# Patient Record
Sex: Female | Born: 1955
Health system: Southern US, Community
[De-identification: ages and names within clinical notes are randomized; demographics above are authoritative.]

## PROBLEM LIST (undated history)

## (undated) DIAGNOSIS — N9489 Other specified conditions associated with female genital organs and menstrual cycle: Secondary | ICD-10-CM

## (undated) DIAGNOSIS — F419 Anxiety disorder, unspecified: Secondary | ICD-10-CM

## (undated) DIAGNOSIS — H9190 Unspecified hearing loss, unspecified ear: Secondary | ICD-10-CM

## (undated) DIAGNOSIS — I10 Essential (primary) hypertension: Secondary | ICD-10-CM

## (undated) DIAGNOSIS — M1990 Unspecified osteoarthritis, unspecified site: Secondary | ICD-10-CM

## (undated) DIAGNOSIS — M797 Fibromyalgia: Secondary | ICD-10-CM

## (undated) DIAGNOSIS — D649 Anemia, unspecified: Secondary | ICD-10-CM

## (undated) DIAGNOSIS — R7989 Other specified abnormal findings of blood chemistry: Secondary | ICD-10-CM

## (undated) DIAGNOSIS — K9041 Non-celiac gluten sensitivity: Secondary | ICD-10-CM

## (undated) DIAGNOSIS — K5792 Diverticulitis of intestine, part unspecified, without perforation or abscess without bleeding: Secondary | ICD-10-CM

## (undated) DIAGNOSIS — N83201 Unspecified ovarian cyst, right side: Secondary | ICD-10-CM

## (undated) HISTORY — PX: ABDOMINAL HYSTERECTOMY: SHX81

## (undated) HISTORY — DX: Unspecified ovarian cyst, right side: N83.201

---

## 1999-12-28 ENCOUNTER — Emergency Department (HOSPITAL_COMMUNITY): Admission: EM | Admit: 1999-12-28 | Discharge: 1999-12-28 | Payer: Self-pay | Admitting: Emergency Medicine

## 2002-01-11 ENCOUNTER — Encounter (INDEPENDENT_AMBULATORY_CARE_PROVIDER_SITE_OTHER): Payer: Self-pay

## 2002-01-12 ENCOUNTER — Inpatient Hospital Stay (HOSPITAL_COMMUNITY): Admission: RE | Admit: 2002-01-12 | Discharge: 2002-01-16 | Payer: Self-pay | Admitting: *Deleted

## 2002-01-13 ENCOUNTER — Encounter: Payer: Self-pay | Admitting: *Deleted

## 2002-01-14 ENCOUNTER — Encounter: Payer: Self-pay | Admitting: Gynecology

## 2002-01-14 ENCOUNTER — Encounter: Payer: Self-pay | Admitting: *Deleted

## 2002-01-15 ENCOUNTER — Encounter: Payer: Self-pay | Admitting: Urology

## 2002-01-19 ENCOUNTER — Encounter: Payer: Self-pay | Admitting: Urology

## 2002-01-19 ENCOUNTER — Ambulatory Visit (HOSPITAL_COMMUNITY): Admission: RE | Admit: 2002-01-19 | Discharge: 2002-01-19 | Payer: Self-pay | Admitting: Urology

## 2002-01-24 ENCOUNTER — Ambulatory Visit (HOSPITAL_COMMUNITY): Admission: RE | Admit: 2002-01-24 | Discharge: 2002-01-24 | Payer: Self-pay | Admitting: Urology

## 2002-01-24 ENCOUNTER — Encounter: Payer: Self-pay | Admitting: Urology

## 2002-05-04 ENCOUNTER — Encounter: Payer: Self-pay | Admitting: Urology

## 2002-05-04 ENCOUNTER — Encounter: Admission: RE | Admit: 2002-05-04 | Discharge: 2002-05-04 | Payer: Self-pay | Admitting: Urology

## 2005-02-25 ENCOUNTER — Other Ambulatory Visit: Admission: RE | Admit: 2005-02-25 | Discharge: 2005-02-25 | Payer: Self-pay | Admitting: *Deleted

## 2007-10-26 ENCOUNTER — Emergency Department (HOSPITAL_COMMUNITY): Admission: EM | Admit: 2007-10-26 | Discharge: 2007-10-26 | Payer: Self-pay | Admitting: Emergency Medicine

## 2008-01-28 ENCOUNTER — Other Ambulatory Visit: Admission: RE | Admit: 2008-01-28 | Discharge: 2008-01-28 | Payer: Self-pay | Admitting: Family Medicine

## 2010-08-09 NOTE — Discharge Summary (Signed)
NAME:  Anna [name], Anna [name]                      ACCOUNT NO.:  [id number]   MEDICAL RECORD NO.:  [id number]                   [name] TYPE:   LOCATION:                                       FACILITY:  [facility]   PHYSICIAN:  Katy Fitch, M.D.               DATE OF BIRTH:  [birth year]   DATE OF ADMISSION:  01/11/2002  DATE OF DISCHARGE:  01/16/2002                                 DISCHARGE SUMMARY   DISCHARGE DIAGNOSES:  1. A 12 week fibroid uterus.  2. Dysfunctional uterine bleeding.  3. Left flank pain.  4. Left hydronephrosis.   PROCEDURE:  1. Total vaginal hysterectomy.  2. Cystoscopy.  3. Left retrograde pyelogram.  4. Attempted insertion of left double J catheter.  5. Peritoneal nephrostomy.   HISTORY OF PRESENT ILLNESS:  Anna [name] is a 55 year old gravida 2, para 2  who was referred by Clovis Riley at [facility] for a six month  history of heavy vaginal bleeding.  Anna [name] also experienced pain with  her periods.  Ultrasound showed a 12 x 7 sized uterus with intramural  fibroids.  Saline infusion sonogram was done to rule out any type of  hyperplastic or neoplastic process.   PAST MEDICAL HISTORY:  Negative.   PAST SURGICAL HISTORY:  Negative.   ALLERGIES:  Anna [name] has no allergies.   MEDICATIONS:  Is on no medications.   She is interested in definitive surgical therapy.   HOSPITAL COURSE:  Anna [name] was admitted on January 11, 2002.  Total  vaginal hysterectomy was performed by Dr. Penni Homans, assisted by Dr.  Lily Peer under general anesthesia.  Findings included a 12 week multiple  fibroid uterus that needed to be morcellated, normal appearing tubes and  ovaries.  Postoperatively [name] developed left lower quadrant pain with  radiation to Anna left flank.  Pelvic ultrasound showed no pelvic mass or  free fluid.  She continued to complain of abdominal pain.  Did not have a  bowel movement.  CT scan of Anna abdomen and pelvis showed moderate  left  hydronephrosis and a dilated ureter in Anna pelvis.  Urological consult was  obtained with Dr. Lindaann Slough.  Anna [name] also did have an elevated  temperature to 101.2.  Cystoscopy, left retrograde pyelogram, and attempted  insertion of left double J catheter was performed by Dr. Brunilda Payor and it was  necessary to perform a peritoneal nephrostomy to attempt antegrade insertion  of a double J catheter.  Postoperatively after Anna second procedure [name]  did well and was able to be discharged in satisfactory condition on December 27, 2001.  Postoperative CBC:  Hematocrit 26.7, hemoglobin 9.2, WBC 6.9,  platelets 160,000.   DISPOSITION:  Anna [name] is to be followed up postoperative at [facility] and also postoperatively with Dr. Brunilda Payor.     [name] . [name], N.P.  Katy Fitch, M.D.    MKH/MEDQ  D:  02/22/2002  T:  02/22/2002  Job:  [id number]

## 2010-08-09 NOTE — Consult Note (Signed)
   NAME:  Anna [name], Anna [name]                      ACCOUNT NO.:  [id number]   MEDICAL RECORD NO.:  [id number]                   PATIENT TYPE:  INP   LOCATION:  9137                                 FACILITY:  [facility]   PHYSICIAN:  Lindaann Slough, M.D.               DATE OF BIRTH:  10-02-1955   DATE OF CONSULTATION:  01/14/2002  DATE OF DISCHARGE:                                   CONSULTATION   REASON FOR CONSULTATION:  Left flank pain and left hydronephrosis.   REFERRING PHYSICIAN:  Timothy P. Fontaine, M.D.   HISTORY OF PRESENT ILLNESS:  The patient is a 55 year old female who had a  total vaginal hysterectomy on 01/11/02.  On the evening of 01/12/02 she  started having left lower quadrant pain with radiation to the left flank.  An ultrasound of the pelvis showed no pelvic mass or free pelvic fluid.  She  continued to complain of abdominal pain and did not have a bowel movement.  A CT scan of the abdomen and pelvis showed moderate left hydronephrosis and  a dilated ureter into the pelvis.  There is no contrast in the ureter.  The  right kidney is normal.   PHYSICAL EXAMINATION:  VITAL SIGNS: Her temperature was up to 101.2 on  01/13/02 but on 01/14/02 temperature maximum was 99.  Blood pressure is  169/88 and pulse 92, respirations 16.   IMPRESSION:  Left hydronephrosis secondary to either partial or complete  ureteral obstruction.   SUGGESTIONS:  Cystoscopy left retrograde pyelogram with insertion of double  J catheter if the obstruction is partial.  If, however, the obstruction is  complete the patient will need exploration of the left ureter with ureteral  reimplantation with psoas or a Boari flap.   The procedures, risks, and the benefits have been discussed with the  patient, her mother, and her son.  They understand and are agreeable.   We will schedule for the procedure this evening.                                               Lindaann Slough, M.D.    MN/MEDQ  D:   01/14/2002  T:  01/15/2002  Job:  [id number]   cc:   Marcial Pacas P. Audie Box, M.D.  [address], [address]  [place]  [place] [address]  Fax: [phone]   Katy Fitch, MD  [address]  [place]  [place] [address]  Fax: [phone]

## 2010-08-09 NOTE — H&P (Signed)
NAME:  Anna [name], Anna [name]                      ACCOUNT NO.:  [id number]   MEDICAL RECORD NO.:  [id number]                   PATIENT TYPE:  AMB   LOCATION:  SDC                                  [facility]ILITY[facility]   PHYSICIAN:  Katy Fitch, MD                 DATE OF BIRTH:  Dec 25, 1955   DATE OF ADMISSION:  DATE OF DISCHARGE:                                HISTORY & PHYSICAL   CHIEF COMPLAINT:  Heavy menstrual periods.   HISTORY OF PRESENT ILLNESS:  The patient is a 55 year old G2, P2 who was  referred by Clovis Riley for a six month history of heavy vaginal bleeding  with her periods.  The patient has also experienced increasing pain with her  periods which when ultrasound in her office showing a 12 x 7 size uterus  with intramural fibroids.  The patient had blood levels drawn of a TSH and  prolactin which were noted to be normal.  The patient was referred over to  rule out any type of hyperplastic or neoplastic process for which the  patient underwent saline infusion sonogram which showed an intramural myoma  abutting the endometrial lining and endometrial biopsy was benign showing  proliferative endometrium.  The patient was given options for medical  therapy versus definitive surgical therapy which the patient is opting for  vaginal hysterectomy.   PAST MEDICAL HISTORY:  None.   PAST SURGICAL HISTORY:  None.   MEDICATIONS:  None.   ALLERGIES:  None.   SOCIAL HISTORY:  Denies any tobacco, alcohol, or drugs.   FAMILY HISTORY:  No epithelial cancers.   PHYSICAL EXAMINATION:  VITAL SIGNS:  Blood pressure 144/82.  HEENT:  Throat clear.  LUNGS:  Clear to auscultation bilaterally.  HEART:  Regular rate and rhythm.  ABDOMEN:  Soft, nontender.  No palpable masses.  EXTREMITIES:  Without any edema, clubbing, cyanosis, or tenderness.  PELVIC:  Mildly enlarged 10 week sized uterus.  Negative for tenderness in  the adnexa.   ASSESSMENT/PLAN:  This 55 year old G2, P2 with  menorrhagia and dysmenorrhea.  The patient is scheduled for total vaginal hysterectomy.  The patient is  explained risks of the procedure including need for laparotomy and possibly  removal of one or both ovaries if appearance is abnormal.  The patient has  given [place] permission for both of these procedures.  Other risks include  bleeding, transfusion, hepatitis or HIV transmission, infections, scar  tissue,  wound breakdown, damage to internal organs, fistula formation, infertility,  anesthetic complications, blood clots, stroke, pulmonary embolism,  myocardial infarction, or death.  The patient states she understands these  risks and desires to proceed.  The patient is scheduled for CBC and urine  pregnancy and on the 21st is to have her procedure.  Katy Fitch, MD    DC/MEDQ  D:  01/07/2002  T:  01/07/2002  Job:  [id number]

## 2010-08-09 NOTE — Op Note (Signed)
NAME:  Anna [name], Anna [name]                      ACCOUNT NO.:  [id number]   MEDICAL RECORD NO.:  [id number]                   PATIENT TYPE:  INP   LOCATION:  9137                                 [facility]ACILITY[facility]   PHYSICIAN:  Lindaann Slough, M.D.               DATE OF BIRTH:  24-Aug-1955   DATE OF PROCEDURE:  01/14/2002  DATE OF DISCHARGE:  01/16/2002                                 OPERATIVE REPORT   PREOPERATIVE DIAGNOSIS:  Left hydronephrosis.   POSTOPERATIVE DIAGNOSIS:  Left hydronephrosis.   PROCEDURE:  Cystoscopy, left retrograde pyelogram and attempted insertion of  left double-J catheter.   SURGEON:  Lindaann Slough, M.D.   ANESTHESIA:  General.   INDICATIONS FOR PROCEDURE:  The patient is a 55 year old female who had a  total vaginal hysterectomy on January 11, 2001.  The day after surgery she  started having left lower quadrant pain radiating to the left flank.  A  pelvic ultrasound showed no free pelvic fluid.  She continued to complain of  pain.  A CT scan of the abdomen and pelvis showed left hydronephrosis with  no contrast in the ureter; and the ureter is dilated into the pelvis.  She  was then scheduled for cystoscopy, left retrograde pyelogram, insertion of  double-J catheter, or if there is accompanied obstruction exploration of the  ureter with ureteral re-implantation.   DESCRIPTION OF PROCEDURE:  Under general anesthesia, the patient was prepped  and draped and placed in the dorsal lithotomy position.  A #22 Wappler  cystoscope was inserted in the bladder.  There was no stone or tumor in the  bladder.  The bladder mucosa is edematous and palpable particularly at the  base of the bladder.  The cone-tipped catheter was then passed through the  cystoscope into the left ureteral orifice.  Contrast was then injected  through the cone-tipped catheter.  The edema at the distal ureter and a J-  hook deformity of the distal ureter with the contrast was passed into a  mildly dilated ureter all the way up into the calices.  The cone-tipped  catheter was then removed.  Several attempts were made to pass a Glidewire  into the ureter, but it was not successful.  The Glidewire could not be  passed to the edge of intramural ureter.  The cystoscope was then removed.  The rigid ureteroscope was then passed in the bladder and passed into the  ureteral orifice but could not be passed beyond the intramural ureter.  The  ureteroscope was then removed.  The cystoscope was then reinserted in the  bladder, and the bladder was emptied, and the cystoscope was removed.   The patient tolerated the procedure well.   PLAN:  The plan is to do a peritoneal nephrostomy and to attempt an  antegrade insertion of a double-J catheter.  Lindaann Slough, M.D.    MN/MEDQ  D:  01/15/2002  T:  01/16/2002  Job:  [id number]   cc:   Marcial Pacas P. Audie Box, M.D.  [address], [address]  [place]  [place] [address]  Fax: [phone]   Katy Fitch, MD  [address]  [place]  [place] [address]  Fax: [phone]

## 2010-08-09 NOTE — Op Note (Signed)
NAME:  Anna, [name]                      ACCOUNT NO.:  [id number]   MEDICAL RECORD NO.:  [id number]                   PATIENT TYPE:  OBV   LOCATION:  9198                                 [facility]ILITY[facility]   PHYSICIAN:  Katy Fitch, MD                 DATE OF BIRTH:  08/21/1955   DATE OF PROCEDURE:  01/11/2002  DATE OF DISCHARGE:                                 OPERATIVE REPORT   PREOPERATIVE DIAGNOSES:  1. Twelve week fibroid uterus.  2. Dysfunctional uterine bleeding.   POSTOPERATIVE DIAGNOSES:  1. Twelve week fibroid uterus.  2. Dysfunctional uterine bleeding.   PROCEDURE:  Total vaginal hysterectomy.   SURGEON:  Katy Fitch, M.D.   ASSISTANTGaetano Hawthorne. Lily Peer, M.D.   ANESTHESIA:  General.   ESTIMATED BLOOD LOSS:  1100 cc.   URINE OUTPUT:  350 cc.   FLUIDS:  3.7 liters.   COMPLICATIONS:  None.   SPECIMENS:  Uterus.   FINDINGS:  Twelve week multiple fibroid uterus that needed to be  morcellated. Normal appearing tubes and ovaries, however.   DESCRIPTION OF PROCEDURE:  The patient was taken to the operating room after  consents and risks of the procedure were discussed.  The patient was given  general anesthesia and then prepped and draped in a normal sterile fashion.  The patient was placed in [place] stirrups and had a Foley catheter inserted  in the bladder.  A weighted speculum was placed in the vagina, and the  anterior lip of the cervix was then grasped with a Jacob's tenaculum, and  the cervicovaginal mucosa was then injected with Pitressin solution 20 units  in 60 cc of saline.  The cervicovaginal mucosa was then incised  circumferentially with a scalpel and the underlying pubovesical cervical  fascia was then dissected off both bluntly and sharply using a curved Mayo  scissor.  Entrance into the posterior cul-de-sac was accomplished without  difficulty after identifying the posterior peritoneum and entering it  sharply.  A long weighted  speculum was then placed into the pelvic cavity.  At this point, further dissection was continued laterally and anteriorly.  I  then was able to get into the anterior cul-de-sac at this point.  The  uterosacral vessels were both clamped and cut on either side and held with 0  Vicryl suture.  The cardinal ligaments were then continued to be clamped and  cut and used 0 Vicryl suture to ligate these pedicles.  Once the cardinal  ligaments were cut, the attempt to get into the bladder was then further  entertained and with sharp dissection using Metzenbaum scissors and a finger  on the vertex we were able to we were able to get into the anterior cul-de-  sac without any evidence of injury to the bladder.  A retractor was then  placed between the uterus and the bladder and the uterus was then started to  begin morcellation  due to the length of the cervix and the size of the  uterus.  The uterus was morcellated using the scalpel and at this point once  down to the level of the last pedicle the next set of pedicles were clamped  and cut and suture ligated with 0 Vicryl.  The next set of pedicles  contained the uterine vessels and these were clamped and cut and ligated  with 0 Vicryl suture.  Morcellation was again was used to help thread it  through the uterus and at this point, the uterus was still stuck in the  pelvis.  The broad ligaments were then clamped on either side, and suture  ligated with 0 Vicryl.  Morcellation at this point, there was noted to be a  very large anterior myoma and with the previous morcellation grasping the  anterior portion of the uterus, this fibroid was able to be brought down  into the field of view and this was begun to be morcellated which then made  seeing the uterus and pedicles much easier.  At this point, once the  morcellation of the anterior fibroid, there a posterior fibroid that was  morcellated out.  The uterus continued to be cored centrally and at this   point it had began to fold on itself.  The left infundibulopelvic ligament  and utero-ovarian ligaments were identified.  The patient did not wish to  have her ovaries removed, and the tubes and ovaries looked normal on that  side.  This was clamped and transected with Mayo scissors and doubly ligated  with 0 Vicryl.  This freed up the left side.  Two more bites were then used  to clamp across the utero-ovarian pedicles on the right side to remove the  rest of the specimen.  This was accomplished and suture ligated with 0  Vicryl.  The pelvis was then irrigated with warm normal saline, and a  vaginal pack was placed.  There was an area on the right side wall that was  noted to have minimal oozing, and a figure-of-eight suture was placed, and  noted to be controlled after placement.  At this point, running of the  vaginal cuff from 3 o'clock to 9 o'clock was done using a running 1-0 Vicryl  on a continuous stitch.  This was done interlocking and hemostasis was noted  to be adequate.  The cuff was then closed with the angles being closed first  in a vertical mattress, first on the left, then on the right.  The rest of  the vaginal cuff was closed with interrupteds of vertical mattresses using 0  Vicryl suture.  At the end, it was noted to be hemostatic and irrigated  despite having morcellation and some oozing from the vaginal cuff.  Throughout the surgery, I felt it would be recommended to pack her vagina  her 2 inch Kerlix with Estrace cream and leave a Foley in for 24 hours.  The  patient was then taken to the recovery room in stable condition.                                               Katy Fitch, MD    DC/MEDQ  D:  01/11/2002  T:  01/11/2002  Job:  [id number]

## 2010-12-20 LAB — POCT I-STAT, CHEM 8
BUN: 17
Calcium, Ion: 1.24
Creatinine, Ser: 1.1
Glucose, Bld: 113 — ABNORMAL HIGH
Hemoglobin: 14.3
Sodium: 141
TCO2: 30

## 2010-12-20 LAB — POCT CARDIAC MARKERS: CKMB, poc: 1.4

## 2014-11-02 ENCOUNTER — Other Ambulatory Visit: Payer: Self-pay | Admitting: Family Medicine

## 2014-11-02 DIAGNOSIS — Z1231 Encounter for screening mammogram for malignant neoplasm of breast: Secondary | ICD-10-CM

## 2014-11-10 ENCOUNTER — Ambulatory Visit: Payer: Self-pay

## 2014-11-17 ENCOUNTER — Ambulatory Visit
Admission: RE | Admit: 2014-11-17 | Discharge: 2014-11-17 | Disposition: A | Discharge status: Home / Self Care | Payer: 59 | Source: Ambulatory Visit | Attending: Family Medicine | Admitting: Family Medicine

## 2014-11-17 DIAGNOSIS — Z1231 Encounter for screening mammogram for malignant neoplasm of breast: Secondary | ICD-10-CM

## 2015-07-11 DIAGNOSIS — M797 Fibromyalgia: Secondary | ICD-10-CM | POA: Diagnosis not present

## 2015-07-11 DIAGNOSIS — I10 Essential (primary) hypertension: Secondary | ICD-10-CM | POA: Diagnosis not present

## 2015-07-11 DIAGNOSIS — M7740 Metatarsalgia, unspecified foot: Secondary | ICD-10-CM | POA: Diagnosis not present

## 2015-07-13 ENCOUNTER — Ambulatory Visit (INDEPENDENT_AMBULATORY_CARE_PROVIDER_SITE_OTHER): Payer: BLUE CROSS/BLUE SHIELD

## 2015-07-13 ENCOUNTER — Encounter: Payer: Self-pay | Admitting: Podiatry

## 2015-07-13 ENCOUNTER — Ambulatory Visit (INDEPENDENT_AMBULATORY_CARE_PROVIDER_SITE_OTHER): Payer: BLUE CROSS/BLUE SHIELD | Admitting: Podiatry

## 2015-07-13 VITALS — BP 153/92 | HR 78 | Resp 16 | Ht 64.0 in | Wt 186.0 lb

## 2015-07-13 DIAGNOSIS — M779 Enthesopathy, unspecified: Secondary | ICD-10-CM | POA: Diagnosis not present

## 2015-07-13 DIAGNOSIS — M7740 Metatarsalgia, unspecified foot: Secondary | ICD-10-CM

## 2015-07-13 MED ORDER — PREDNISONE 10 MG PO TABS: ORAL_TABLET | ORAL | Status: DC

## 2015-07-13 MED ORDER — TRIAMCINOLONE ACETONIDE 10 MG/ML IJ SUSP
10.0000 mg | Freq: Once | INTRAMUSCULAR | Status: AC
  Administered 2015-07-13: 10 mg

## 2015-07-13 NOTE — Progress Notes (Signed)
   Subjective:    Patient ID: Anna [name], female    DOB: 04/04/1955, 60 y.o.   MRN: [id number]  HPI Chief Complaint  Patient presents with  . Foot Pain    Bilateral; Dorsal; pt stated, "both feet just ache; bone of feet hurt; hurts to walk; tender; Left foot hurts more on ball of foot"; x3 weeks  . Toe Pain    Left foot; great toe-joint; pt stated, "Has fibromyalgia"; x2 days      Review of Systems  Constitutional: Positive for activity change and fatigue.  Gastrointestinal: Positive for constipation.  Musculoskeletal: Positive for myalgias, back pain, arthralgias and gait problem.  Allergic/Immunologic: Positive for environmental allergies.  Neurological: Positive for dizziness and light-headedness.  All other systems reviewed and are negative.      Objective:   Physical Exam        Assessment & Plan:

## 2015-07-15 NOTE — Progress Notes (Signed)
Subjective:     Patient ID: Anna [name], female   DOB: [birth year], 60 y.o.   MRN: [id number]  HPI patient states she has pain in the forefoot bilateral and it's just been getting worse over the last few weeks. States that she's tried to change her gait and that's causing other problems and she's had chronic pain on the dorsal of both feet but that's not part of which she is experiencing now   Review of Systems  All other systems reviewed and are negative.      Objective:   Physical Exam  Constitutional: She is oriented to person, place, and time.  Cardiovascular: Intact distal pulses.   Musculoskeletal: Normal range of motion.  Neurological: She is oriented to person, place, and time.  Skin: Skin is warm.  Nursing note and vitals reviewed.  neurovascular status intact muscle strength adequate range of motion within normal limits with patient found to have discomfort in the second metatarsophalangeal joints bilateral with fluid buildup and moderate discomfort in the dorsum of both feet localized in nature. Patient has good digital perfusion and is well oriented 3     Assessment:     Inflammatory tendinitis of the dorsal foot with inflammatory acute capsulitis second MPJ bilateral    Plan:     H&P and x-rays reviewed with patient. Today I did proximal nerve blocks of the forefoot bilateral and aspirated the second MPJ bilateral getting out a small amount of clear fluid and injected with quarter cc dexamethasone Kenalog. I then went ahead and applied padding to reduce pressure and explained we may need to make orthotics and other treatments and she'll reappoint again in 2 weeks to see results placed her on a sterile prep DS Dosepak at the current time  X-ray report indicates that there is mild depression of the arch but no indications of advanced arthritis or stress fracture

## 2015-08-03 ENCOUNTER — Ambulatory Visit: Payer: BLUE CROSS/BLUE SHIELD | Admitting: Podiatry

## 2015-08-24 DIAGNOSIS — L309 Dermatitis, unspecified: Secondary | ICD-10-CM | POA: Diagnosis not present

## 2015-09-13 ENCOUNTER — Ambulatory Visit (HOSPITAL_BASED_OUTPATIENT_CLINIC_OR_DEPARTMENT_OTHER): Admit: 2015-09-13 | Payer: Self-pay | Admitting: Oral Surgery

## 2015-09-13 ENCOUNTER — Encounter (HOSPITAL_BASED_OUTPATIENT_CLINIC_OR_DEPARTMENT_OTHER): Payer: Self-pay

## 2015-09-13 SURGERY — OSTEOTOMY, LE FORT I, MAXILLA, WITH MANDIBULAR OSTEOTOMY
Anesthesia: General | Site: Mouth

## 2015-10-10 ENCOUNTER — Emergency Department (HOSPITAL_COMMUNITY): Payer: BLUE CROSS/BLUE SHIELD

## 2015-10-10 ENCOUNTER — Emergency Department (HOSPITAL_COMMUNITY)
Admission: EM | Admit: 2015-10-10 | Discharge: 2015-10-10 | Disposition: A | Discharge status: Home / Self Care | Payer: BLUE CROSS/BLUE SHIELD | Attending: Emergency Medicine | Admitting: Emergency Medicine

## 2015-10-10 ENCOUNTER — Encounter (HOSPITAL_COMMUNITY): Payer: Self-pay | Admitting: Emergency Medicine

## 2015-10-10 DIAGNOSIS — K625 Hemorrhage of anus and rectum: Secondary | ICD-10-CM | POA: Diagnosis not present

## 2015-10-10 DIAGNOSIS — K5733 Diverticulitis of large intestine without perforation or abscess with bleeding: Secondary | ICD-10-CM | POA: Insufficient documentation

## 2015-10-10 DIAGNOSIS — Z79899 Other long term (current) drug therapy: Secondary | ICD-10-CM | POA: Insufficient documentation

## 2015-10-10 DIAGNOSIS — I10 Essential (primary) hypertension: Secondary | ICD-10-CM | POA: Insufficient documentation

## 2015-10-10 DIAGNOSIS — K5732 Diverticulitis of large intestine without perforation or abscess without bleeding: Secondary | ICD-10-CM | POA: Diagnosis not present

## 2015-10-10 HISTORY — DX: Unspecified osteoarthritis, unspecified site: M19.90

## 2015-10-10 HISTORY — DX: Anxiety disorder, unspecified: F41.9

## 2015-10-10 HISTORY — DX: Non-celiac gluten sensitivity: K90.41

## 2015-10-10 HISTORY — DX: Essential (primary) hypertension: I10

## 2015-10-10 LAB — BASIC METABOLIC PANEL
ANION GAP: 7 (ref 5–15)
BUN: 10 mg/dL (ref 6–20)
CALCIUM: 9.5 mg/dL (ref 8.9–10.3)
CO2: 27 mmol/L (ref 22–32)
CREATININE: 1 mg/dL (ref 0.44–1.00)
Chloride: 104 mmol/L (ref 101–111)
GLUCOSE: 110 mg/dL — AB (ref 65–99)
Potassium: 3.3 mmol/L — ABNORMAL LOW (ref 3.5–5.1)
Sodium: 138 mmol/L (ref 135–145)

## 2015-10-10 LAB — POC OCCULT BLOOD, ED: Fecal Occult Bld: POSITIVE — AB

## 2015-10-10 LAB — CBC WITH DIFFERENTIAL/PLATELET
BASOS ABS: 0 10*3/uL (ref 0.0–0.1)
BASOS PCT: 0 %
EOS ABS: 0.1 10*3/uL (ref 0.0–0.7)
Eosinophils Relative: 1 %
HEMATOCRIT: 40.5 % (ref 36.0–46.0)
Hemoglobin: 13.1 g/dL (ref 12.0–15.0)
Lymphocytes Relative: 15 %
Lymphs Abs: 1.6 10*3/uL (ref 0.7–4.0)
MCH: 31.1 pg (ref 26.0–34.0)
MCHC: 32.3 g/dL (ref 30.0–36.0)
MCV: 96.2 fL (ref 78.0–100.0)
MONO ABS: 0.8 10*3/uL (ref 0.1–1.0)
Monocytes Relative: 7 %
NEUTROS ABS: 8.7 10*3/uL — AB (ref 1.7–7.7)
Neutrophils Relative %: 77 %
PLATELETS: 144 10*3/uL — AB (ref 150–400)
RBC: 4.21 MIL/uL (ref 3.87–5.11)
RDW: 13.1 % (ref 11.5–15.5)
WBC: 11.2 10*3/uL — ABNORMAL HIGH (ref 4.0–10.5)

## 2015-10-10 LAB — ABO/RH: ABO/RH(D): A NEG

## 2015-10-10 LAB — TYPE AND SCREEN
ABO/RH(D): A NEG
ANTIBODY SCREEN: NEGATIVE

## 2015-10-10 MED ORDER — IOPAMIDOL (ISOVUE-300) INJECTION 61%
INTRAVENOUS | Status: AC
  Administered 2015-10-10: 100 mL
  Filled 2015-10-10: qty 100

## 2015-10-10 MED ORDER — HYDROCODONE-ACETAMINOPHEN 5-325 MG PO TABS: 1.0000 | ORAL_TABLET | ORAL | Status: DC | PRN

## 2015-10-10 MED ORDER — SODIUM CHLORIDE 0.9 % IV BOLUS (SEPSIS)
1000.0000 mL | Freq: Once | INTRAVENOUS | Status: AC
  Administered 2015-10-10: 1000 mL via INTRAVENOUS

## 2015-10-10 MED ORDER — CIPROFLOXACIN HCL 500 MG PO TABS: 500.0000 mg | ORAL_TABLET | Freq: Two times a day (BID) | ORAL | Status: DC

## 2015-10-10 MED ORDER — ONDANSETRON HCL 4 MG PO TABS: 4.0000 mg | ORAL_TABLET | Freq: Three times a day (TID) | ORAL | Status: DC | PRN

## 2015-10-10 MED ORDER — ONDANSETRON HCL 4 MG/2ML IJ SOLN
4.0000 mg | Freq: Once | INTRAMUSCULAR | Status: AC
  Administered 2015-10-10: 4 mg via INTRAVENOUS
  Filled 2015-10-10: qty 2

## 2015-10-10 MED ORDER — METRONIDAZOLE 500 MG PO TABS: 500.0000 mg | ORAL_TABLET | Freq: Three times a day (TID) | ORAL | Status: DC

## 2015-10-10 MED ORDER — MORPHINE SULFATE (PF) 4 MG/ML IV SOLN
4.0000 mg | Freq: Once | INTRAVENOUS | Status: AC
  Administered 2015-10-10: 4 mg via INTRAVENOUS
  Filled 2015-10-10: qty 1

## 2015-10-10 NOTE — ED Notes (Signed)
Assisted pt up to RR; tolerated well.

## 2015-10-10 NOTE — ED Provider Notes (Signed)
CSN: [id number]     Arrival date & time 10/10/15  0850 History   First MD Initiated Contact with Patient 10/10/15 0945     Chief Complaint  Patient presents with  . Rectal Bleeding     (Consider location/radiation/quality/duration/timing/severity/associated sxs/prior Treatment) HPI   Pt with hx gluten intolerance and constipation p/w LLQ abdominal pain and cramping, distension, with rectal bleeding and nausea.  States yesterday she developed severe abdominal cramping followed by violent diarrhea, followed by waves of cramping with hematochezia.  States this occurred for two hours yesterday and improved, allowing her to sleep.  She was awoken this morning with intense cramping followed by passing gas and blood per rectum.  Has had associated nausea.  Denies fevers, chills, myalgias, vomiting, urinary , or vaginal symptoms.  Has hx 1 prior abdominal surgery: partial hysterectomy for fibroids.  Has never had a colonoscopy.  Does not have a GI dr.    Past Medical History  Diagnosis Date  . Hypertension   . Anxiety   . Arthritis   . Gluten intolerance    Past Surgical History  Procedure Laterality Date  . Abdominal hysterectomy     History reviewed. No pertinent family history. Social History  Substance Use Topics  . Smoking status: Never Smoker   . Smokeless tobacco: None  . Alcohol Use: 1.2 oz/week    2 Glasses of wine per week   OB History    No data available     Review of Systems  All other systems reviewed and are negative.     Allergies  Glutethimides and Zyrtec  Home Medications   Prior to Admission medications   Medication Sig Start Date End Date Taking? Authorizing Provider  ALPRAZolam Duanne Moron) 0.5 MG tablet Take 0.5 mg by mouth daily as needed for anxiety.   Yes Historical Provider, MD  lisinopril-hydrochlorothiazide (PRINZIDE,ZESTORETIC) 20-12.5 MG tablet Take 1 tablet by mouth daily.  05/08/15  Yes Historical Provider, MD  nabumetone (RELAFEN) 500 MG tablet  Take 500 mg by mouth 2 (two) times daily as needed for mild pain.  07/11/15  Yes Historical Provider, MD  nortriptyline (PAMELOR) 10 MG capsule Take 20 mg by mouth at bedtime.  07/11/15  Yes Historical Provider, MD  predniSONE (DELTASONE) 10 MG tablet 12 day tapering dose Patient not taking: Reported on 10/10/2015 07/13/15   Tamala Fothergill Regal, DPM   Pulse 102  Temp(Src) 98.7 F (37.1 C) (Oral)  Resp 18  Wt 84.369 kg  SpO2 99% Physical Exam  Constitutional: She appears well-developed and well-nourished. No distress.  HENT:  Head: Normocephalic and atraumatic.  Neck: Neck supple.  Cardiovascular: Normal rate and regular rhythm.   Pulmonary/Chest: Effort normal and breath sounds normal. No respiratory distress. She has no wheezes. She has no rales.  Abdominal: Soft. Bowel sounds are normal. She exhibits no distension. There is tenderness in the left lower quadrant. There is no rebound and no guarding.  Genitourinary: Rectal exam shows external hemorrhoid. Rectal exam shows no internal hemorrhoid, no mass, no tenderness and anal tone normal. Guaiac positive stool.  Small amount of frank blood on rectal exam   Neurological: She is alert.  Skin: She is not diaphoretic.  Nursing note and vitals reviewed.   ED Course  Procedures (including critical care time) Labs Review Labs Reviewed  BASIC METABOLIC PANEL - Abnormal; Notable for the following:    Potassium 3.3 (*)    Glucose, Bld 110 (*)    All other components within normal limits  CBC WITH DIFFERENTIAL/PLATELET - Abnormal; Notable for the following:    WBC 11.2 (*)    Platelets 144 (*)    Neutro Abs 8.7 (*)    All other components within normal limits  POC OCCULT BLOOD, ED - Abnormal; Notable for the following:    Fecal Occult Bld POSITIVE (*)    All other components within normal limits  TYPE AND SCREEN  ABO/RH    Imaging Review Ct Abdomen Pelvis W Contrast  10/10/2015  CLINICAL DATA:  Left lower quadrant pain for 6 days. Rectal  bleeding EXAM: CT ABDOMEN AND PELVIS WITH CONTRAST TECHNIQUE: Multidetector CT imaging of the abdomen and pelvis was performed using the standard protocol following bolus administration of intravenous contrast. CONTRAST:  12mL ISOVUE-300 IOPAMIDOL (ISOVUE-300) INJECTION 61% COMPARISON:  None. FINDINGS: Lower chest: There is slight posterior lung base atelectasis bilaterally. Lung bases otherwise are clear. Hepatobiliary: Liver measures 19.6 cm in length. There is fatty infiltration near the fissure for the ligamentum teres. There are scattered small cysts throughout the liver. Largest cyst is in the anterior segment of the right lobe of the liver measuring 1.1 x 1.0 cm. Gallbladder wall is not appreciably thickened. There is no biliary duct dilatation. Pancreas: No pancreatic mass or inflammatory focus. Spleen: No splenic lesions are evident. Adrenals/Urinary Tract: Adrenals appear normal bilaterally. There is a cyst arising from the periphery of the mid left kidney measuring 1 x 1 cm. There is no hydronephrosis on either side. There is no renal or ureteral calculus on either side. Urinary bladder is midline with wall thickness within normal limits. Stomach/Bowel: There is wall thickening throughout the distal descending colon and proximal to mid sigmoid colon. There are multiple diverticula in this region. There is mucosal thickening in the proximal to mid sigmoid colon region consistent with a degree of diverticulitis. There is no abscess or perforation evident in this area of diverticulitis. There is a small amount of loculated fluid in the pelvis which may be secondary to the diverticular inflammatory process. Elsewhere, there is no bowel wall or mesenteric thickening. No bowel obstruction. No free air or portal venous air. Vascular/Lymphatic: There is no abdominal aortic aneurysm. No vascular lesions are evident. There is no appreciable adenopathy in the abdomen or pelvis. Reproductive: Uterus is absent.   There is no pelvic mass. Other: Appendix appears normal. There is no abscess or free-flowing ascites in the abdomen or pelvis. Mild loculated ascites in the pelvis is likely secondary to the sigmoid diverticulitis. There is a minimal ventral hernia containing only fat. Musculoskeletal: There is degenerative change in the lower thoracic spine. There are no blastic or lytic bone lesions. There is no intramuscular or abdominal wall lesion. IMPRESSION: Diverticulitis involving the proximal and mid sigmoid colon regions without abscess or perforation. Mild loculated fluid in the pelvis is felt to be most likely due to inflammation from the diverticulitis. No bowel obstruction. No abscess. No free air. Appendix appears normal. No renal or ureteral calculus.  No hydronephrosis. Prominent liver with mild fatty infiltration near the fissure for the ligamentum teres. Scattered small liver cysts are noted. Uterus absent.  Minimal ventral hernia containing only fat. Electronically Signed   By: Lowella Grip III M.D.   On: 10/10/2015 12:02   I have personally reviewed and evaluated these images and lab results as part of my medical decision-making.   EKG Interpretation None      MDM   Final diagnoses:  Diverticulitis of large intestine without perforation or abscess with  bleeding   Afebrile nontoxic patient with LLQ abdominal pain and rectal bleeding with frank blood on rectal exam.  Labs with leukocytosis, normal Hgb.  CT demonstrates diverticulitis without complication.  Pt comfortable with discharge home, discussed strict return precautions.  She is tolerating PO.  D/C home with flagyl, cipro, norco, zofran, PCP, GI follow up.  Discussed result, findings, treatment, and follow up  with patient.  Pt given return precautions.  Pt verbalizes understanding and agrees with plan.         Clayton Bibles, PA-C 10/10/15 [place], MD 10/11/15 [phone]

## 2015-10-10 NOTE — Discharge Instructions (Signed)
Read the information below.  Use the prescribed medication as directed.  Please discuss all new medications with your pharmacist.  Do not take additional tylenol while taking the prescribed pain medication to avoid overdose.  You may return to the Emergency Department at any time for worsening condition or any new symptoms that concern you.    If you develop high fevers, worsening abdominal pain, uncontrolled vomiting, profuse bleeding, lightheadedness, you feel like you are going to faint, or are unable to tolerate fluids or antibiotics by mouth, return to the ER for a recheck.     Diverticulitis Diverticulitis is inflammation or infection of small pouches in your colon that form when you have a condition called diverticulosis. The pouches in your colon are called diverticula. Your colon, or large intestine, is where water is absorbed and stool is formed. Complications of diverticulitis can include:  Bleeding.  Severe infection.  Severe pain.  Perforation of your colon.  Obstruction of your colon. CAUSES  Diverticulitis is caused by bacteria. Diverticulitis happens when stool becomes trapped in diverticula. This allows bacteria to grow in the diverticula, which can lead to inflammation and infection. RISK FACTORS People with diverticulosis are at risk for diverticulitis. Eating a diet that does not include enough fiber from fruits and vegetables may make diverticulitis more likely to develop. SYMPTOMS  Symptoms of diverticulitis may include:  Abdominal pain and tenderness. The pain is normally located on the left side of the abdomen, but may occur in other areas.  Fever and chills.  Bloating.  Cramping.  Nausea.  Vomiting.  Constipation.  Diarrhea.  Blood in your stool. DIAGNOSIS  Your health care provider will ask you about your medical history and do a physical exam. You may need to have tests done because many medical conditions can cause the same symptoms as  diverticulitis. Tests may include:  Blood tests.  Urine tests.  Imaging tests of the abdomen, including X-rays and CT scans. When your condition is under control, your health care provider may recommend that you have a colonoscopy. A colonoscopy can show how severe your diverticula are and whether something else is causing your symptoms. TREATMENT  Most cases of diverticulitis are mild and can be treated at home. Treatment may include:  Taking over-the-counter pain medicines.  Following a clear liquid diet.  Taking antibiotic medicines by mouth for 7-10 days. More severe cases may be treated at a hospital. Treatment may include:  Not eating or drinking.  Taking prescription pain medicine.  Receiving antibiotic medicines through an IV tube.  Receiving fluids and nutrition through an IV tube.  Surgery. HOME CARE INSTRUCTIONS   Follow your health care provider's instructions carefully.  Follow a full liquid diet or other diet as directed by your health care provider. After your symptoms improve, your health care provider may tell you to change your diet. He or she may recommend you eat a high-fiber diet. Fruits and vegetables are good sources of fiber. Fiber makes it easier to pass stool.  Take fiber supplements or probiotics as directed by your health care provider.  Only take medicines as directed by your health care provider.  Keep all your follow-up appointments. SEEK MEDICAL CARE IF:   Your pain does not improve.  You have a hard time eating food.  Your bowel movements do not return to normal. SEEK IMMEDIATE MEDICAL CARE IF:   Your pain becomes worse.  Your symptoms do not get better.  Your symptoms suddenly get worse.  You have  a fever.  You have repeated vomiting.  You have bloody or black, tarry stools. MAKE SURE YOU:   Understand these instructions.  Will watch your condition.  Will get help right away if you are not doing well or get worse.     This information is not intended to replace advice given to you by your health care provider. Make sure you discuss any questions you have with your health care provider.   Document Released: 12/18/2004 Document Revised: 03/15/2013 Document Reviewed: 02/02/2013 Elsevier Interactive Patient Education 2016 Elsevier Inc.  High-Fiber Diet Fiber, also called dietary fiber, is a type of carbohydrate found in fruits, vegetables, whole grains, and beans. A high-fiber diet can have many health benefits. Your health care provider may recommend a high-fiber diet to help:  Prevent constipation. Fiber can make your bowel movements more regular.  Lower your cholesterol.  Relieve hemorrhoids, uncomplicated diverticulosis, or irritable bowel syndrome.  Prevent overeating as part of a weight-loss plan.  Prevent heart disease, type 2 diabetes, and certain cancers. WHAT IS MY PLAN? The recommended daily intake of fiber includes:  38 grams for men under age 67.  71 grams for men over age 47.  34 grams for women under age 35.  35 grams for women over age 60. You can get the recommended daily intake of dietary fiber by eating a variety of fruits, vegetables, grains, and beans. Your health care provider may also recommend a fiber supplement if it is not possible to get enough fiber through your diet. WHAT DO I NEED TO KNOW ABOUT A HIGH-FIBER DIET?  Fiber supplements have not been widely studied for their effectiveness, so it is better to get fiber through food sources.  Always check the fiber content on thenutrition facts label of any prepackaged food. Look for foods that contain at least 5 grams of fiber per serving.  Ask your dietitian if you have questions about specific foods that are related to your condition, especially if those foods are not listed in the following section.  Increase your daily fiber consumption gradually. Increasing your intake of dietary fiber too quickly may cause  bloating, cramping, or gas.  Drink plenty of water. Water helps you to digest fiber. WHAT FOODS CAN I EAT? Grains Whole-grain breads. Multigrain cereal. Oats and oatmeal. Brown rice. Barley. Bulgur wheat. [place]. Bran muffins. Popcorn. Rye wafer crackers. Vegetables Sweet potatoes. Spinach. Kale. Artichokes. Cabbage. Broccoli. Green peas. Carrots. Squash. Fruits Berries. Pears. Apples. Oranges. Avocados. Prunes and raisins. Dried figs. Meats and Other Protein Sources [place], kidney, pinto, and soy beans. Split peas. Lentils. Nuts and seeds. Dairy Fiber-fortified yogurt. Beverages Fiber-fortified soy milk. Fiber-fortified orange juice. Other Fiber bars. The items listed above may not be a complete list of recommended foods or beverages. Contact your dietitian for more options. WHAT FOODS ARE NOT RECOMMENDED? Grains White bread. Pasta made with refined flour. White rice. Vegetables Fried potatoes. Canned vegetables. Well-cooked vegetables.  Fruits Fruit juice. Cooked, strained fruit. Meats and Other Protein Sources Fatty cuts of meat. Fried Sales executive or fried fish. Dairy Milk. Yogurt. Cream cheese. Sour cream. Beverages Soft drinks. Other Cakes and pastries. Butter and oils. The items listed above may not be a complete list of foods and beverages to avoid. Contact your dietitian for more information. WHAT ARE SOME TIPS FOR INCLUDING HIGH-FIBER FOODS IN MY DIET?  Eat a wide variety of high-fiber foods.  Make sure that half of all grains consumed each day are whole grains.  Replace breads and cereals  made from refined flour or white flour with whole-grain breads and cereals.  Replace white rice with brown rice, bulgur wheat, or millet.  Start the day with a breakfast that is high in fiber, such as a cereal that contains at least 5 grams of fiber per serving.  Use beans in place of meat in soups, salads, or pasta.  Eat high-fiber snacks, such as berries, raw vegetables, nuts,  or popcorn.   This information is not intended to replace advice given to you by your health care provider. Make sure you discuss any questions you have with your health care provider.   Document Released: 03/10/2005 Document Revised: 03/31/2014 Document Reviewed: 08/23/2013 Elsevier Interactive Patient Education [employer].

## 2015-10-10 NOTE — ED Notes (Signed)
Patient transported to CT 

## 2015-10-10 NOTE — ED Notes (Signed)
Pt returned from CT.  Denies pain unless she pushes on lower abdomen.

## 2015-10-10 NOTE — ED Notes (Signed)
Reports gluten intolerance and side effect of constipation.  Since yesterday she has had several episodes of "more than an teaspoon" of blood in the toilet and complaint of pain across lower abdomin, L>R.  Has appt on Friday to see her dr but is concerned due to blood and pain so came here.

## 2016-03-03 DIAGNOSIS — I10 Essential (primary) hypertension: Secondary | ICD-10-CM | POA: Diagnosis not present

## 2016-03-03 DIAGNOSIS — M797 Fibromyalgia: Secondary | ICD-10-CM | POA: Diagnosis not present

## 2016-03-03 DIAGNOSIS — Z1211 Encounter for screening for malignant neoplasm of colon: Secondary | ICD-10-CM | POA: Diagnosis not present

## 2016-03-05 ENCOUNTER — Ambulatory Visit (INDEPENDENT_AMBULATORY_CARE_PROVIDER_SITE_OTHER): Payer: BLUE CROSS/BLUE SHIELD

## 2016-03-05 ENCOUNTER — Ambulatory Visit (INDEPENDENT_AMBULATORY_CARE_PROVIDER_SITE_OTHER): Payer: BLUE CROSS/BLUE SHIELD | Admitting: Podiatry

## 2016-03-05 ENCOUNTER — Encounter: Payer: Self-pay | Admitting: Podiatry

## 2016-03-05 VITALS — BP 134/99 | HR 87 | Resp 16

## 2016-03-05 DIAGNOSIS — M779 Enthesopathy, unspecified: Secondary | ICD-10-CM

## 2016-03-05 DIAGNOSIS — M7750 Other enthesopathy of unspecified foot: Secondary | ICD-10-CM

## 2016-03-05 DIAGNOSIS — D169 Benign neoplasm of bone and articular cartilage, unspecified: Secondary | ICD-10-CM

## 2016-03-05 DIAGNOSIS — M79671 Pain in right foot: Secondary | ICD-10-CM

## 2016-03-05 MED ORDER — TRIAMCINOLONE ACETONIDE 10 MG/ML IJ SUSP
10.0000 mg | Freq: Once | INTRAMUSCULAR | Status: AC
  Administered 2016-03-05: 10 mg

## 2016-03-05 NOTE — Progress Notes (Signed)
Subjective:     Patient ID: Anna [name], female   DOB: [birth year], 60 y.o.   MRN: [id number]  HPI patient presents stating she's getting a lot of pain on top of her right foot in the area that we worked on previously is doing well   Review of Systems     Objective:   Physical Exam Neurovascular status intact with tendinitis dorsum of the right foot with inflammation and distal discomfort which has improved quite a bit    Assessment:     Dorsal tendinitis right of the midtarsal joint tenderness process and improved inflammatory condition    Plan:     H&P condition reviewed and tendinitis injection administered right and advised on surgery if symptoms get worse X-rays indicate there is some spurring of the dorsum of the right foot midtarsal joint localized in nature

## 2016-03-24 DIAGNOSIS — Z9289 Personal history of other medical treatment: Secondary | ICD-10-CM

## 2016-03-24 HISTORY — DX: Personal history of other medical treatment: Z92.89

## 2016-08-12 ENCOUNTER — Encounter (INDEPENDENT_AMBULATORY_CARE_PROVIDER_SITE_OTHER): Payer: Self-pay | Admitting: Internal Medicine

## 2016-08-14 ENCOUNTER — Other Ambulatory Visit (INDEPENDENT_AMBULATORY_CARE_PROVIDER_SITE_OTHER): Payer: Self-pay | Admitting: Internal Medicine

## 2016-08-14 ENCOUNTER — Encounter (INDEPENDENT_AMBULATORY_CARE_PROVIDER_SITE_OTHER): Payer: Self-pay | Admitting: Internal Medicine

## 2016-08-14 ENCOUNTER — Ambulatory Visit (INDEPENDENT_AMBULATORY_CARE_PROVIDER_SITE_OTHER): Payer: BLUE CROSS/BLUE SHIELD | Admitting: Internal Medicine

## 2016-08-14 ENCOUNTER — Encounter (INDEPENDENT_AMBULATORY_CARE_PROVIDER_SITE_OTHER): Payer: Self-pay

## 2016-08-14 ENCOUNTER — Encounter (INDEPENDENT_AMBULATORY_CARE_PROVIDER_SITE_OTHER): Payer: Self-pay | Admitting: *Deleted

## 2016-08-14 VITALS — BP 148/80 | HR 64 | Temp 98.4°F | Ht 64.5 in | Wt 180.9 lb

## 2016-08-14 DIAGNOSIS — Z1211 Encounter for screening for malignant neoplasm of colon: Secondary | ICD-10-CM

## 2016-08-14 NOTE — Progress Notes (Addendum)
   Subjective:    Patient ID: Anna [name], female    DOB: [birth year], 61 y.o.   MRN: [id number]      Referred by Dr. Zadie Rhine for a colonoscopy. She has never undergone a colonoscopy. No family hx of colon cancer. Appetite is good. No weight loss. She has a BM x 1 a day. She is not constipated at this time.  She drinks Bone Broth Protein for her BMs.  She is gluten intolerant.   Hx of diverticulitis. Diagnosed last year on CT  Has seen Dr. Paulita Fujita in the past (2016)  Review of Systems   Current Outpatient Prescriptions  Medication Sig Dispense Refill  . ALPRAZolam (XANAX) 0.5 MG tablet Take 0.5 mg by mouth daily as needed for anxiety.    Marland Kitchen lisinopril-hydrochlorothiazide (PRINZIDE,ZESTORETIC) 20-12.5 MG tablet Take 1 tablet by mouth daily.   0   No current facility-administered medications for this visit.    Past Medical History:  Diagnosis Date  . Anxiety   . Arthritis   . Gluten intolerance   . Hypertension          Objective:   Physical Exam Blood pressure (!) 148/80, pulse 64, temperature 98.4 F (36.9 C), height 5' 4.5" (1.638 m), weight 180 lb 14.4 oz (82.1 kg).  Alert and oriented. Skin warm and dry. HR regular. Sclera anicteric. Lungs clear. Abdomen soft. No hepatomegaly. No abdominal pain. No lower extremity edema.          Assessment & Plan:  Screening colonoscopy. There are no exam notes on file for this visit.

## 2016-08-14 NOTE — Telephone Encounter (Signed)
This encounter was created in error - please disregard.

## 2016-08-14 NOTE — Patient Instructions (Signed)
Colonoscopy

## 2016-08-15 ENCOUNTER — Encounter (INDEPENDENT_AMBULATORY_CARE_PROVIDER_SITE_OTHER): Payer: Self-pay

## 2016-09-25 ENCOUNTER — Encounter (INDEPENDENT_AMBULATORY_CARE_PROVIDER_SITE_OTHER): Payer: Self-pay | Admitting: *Deleted

## 2016-09-25 ENCOUNTER — Telehealth (INDEPENDENT_AMBULATORY_CARE_PROVIDER_SITE_OTHER): Payer: Self-pay | Admitting: *Deleted

## 2016-09-25 DIAGNOSIS — Z1211 Encounter for screening for malignant neoplasm of colon: Secondary | ICD-10-CM

## 2016-09-25 MED ORDER — PEG-KCL-NACL-NASULF-NA ASC-C 100 G PO SOLR: 1.0000 | Freq: Once | ORAL | 0 refills | Status: AC

## 2016-09-25 NOTE — Telephone Encounter (Signed)
Patient needs movi prep -- screening

## 2016-09-29 ENCOUNTER — Encounter (INDEPENDENT_AMBULATORY_CARE_PROVIDER_SITE_OTHER): Payer: Self-pay | Admitting: *Deleted

## 2016-10-01 DIAGNOSIS — Z01818 Encounter for other preprocedural examination: Secondary | ICD-10-CM | POA: Diagnosis not present

## 2016-10-01 DIAGNOSIS — M264 Malocclusion, unspecified: Secondary | ICD-10-CM | POA: Diagnosis not present

## 2016-10-01 DIAGNOSIS — M797 Fibromyalgia: Secondary | ICD-10-CM | POA: Diagnosis not present

## 2016-10-01 DIAGNOSIS — I10 Essential (primary) hypertension: Secondary | ICD-10-CM | POA: Diagnosis not present

## 2016-10-06 ENCOUNTER — Telehealth (INDEPENDENT_AMBULATORY_CARE_PROVIDER_SITE_OTHER): Payer: Self-pay | Admitting: *Deleted

## 2016-10-06 NOTE — Telephone Encounter (Signed)
noted 

## 2016-10-06 NOTE — Telephone Encounter (Signed)
Patient called in, needing to cancel TCS for 11/20/16, she's having other surgery and can't this right now, will call back at later date

## 2016-10-08 ENCOUNTER — Encounter (HOSPITAL_BASED_OUTPATIENT_CLINIC_OR_DEPARTMENT_OTHER): Payer: Self-pay | Admitting: *Deleted

## 2016-10-08 NOTE — Progress Notes (Signed)
Pt coming in Tuesday before lunch for BMET and EKG. Bring all medications. Pack an overnight bag for RCC.

## 2016-10-14 ENCOUNTER — Other Ambulatory Visit: Payer: Self-pay

## 2016-10-14 ENCOUNTER — Encounter (HOSPITAL_BASED_OUTPATIENT_CLINIC_OR_DEPARTMENT_OTHER)
Admission: RE | Admit: 2016-10-14 | Discharge: 2016-10-14 | Disposition: A | Discharge status: Home / Self Care | Payer: BLUE CROSS/BLUE SHIELD | Source: Ambulatory Visit | Attending: Oral Surgery | Admitting: Oral Surgery

## 2016-10-14 DIAGNOSIS — J9811 Atelectasis: Secondary | ICD-10-CM | POA: Diagnosis not present

## 2016-10-14 DIAGNOSIS — M26213 Malocclusion, Angle's class III: Secondary | ICD-10-CM | POA: Diagnosis not present

## 2016-10-14 DIAGNOSIS — J90 Pleural effusion, not elsewhere classified: Secondary | ICD-10-CM | POA: Diagnosis not present

## 2016-10-14 DIAGNOSIS — T380X5A Adverse effect of glucocorticoids and synthetic analogues, initial encounter: Secondary | ICD-10-CM | POA: Diagnosis not present

## 2016-10-14 DIAGNOSIS — K9041 Non-celiac gluten sensitivity: Secondary | ICD-10-CM | POA: Diagnosis not present

## 2016-10-14 DIAGNOSIS — M797 Fibromyalgia: Secondary | ICD-10-CM | POA: Diagnosis not present

## 2016-10-14 DIAGNOSIS — R0902 Hypoxemia: Secondary | ICD-10-CM | POA: Diagnosis not present

## 2016-10-14 DIAGNOSIS — E669 Obesity, unspecified: Secondary | ICD-10-CM | POA: Diagnosis present

## 2016-10-14 DIAGNOSIS — F419 Anxiety disorder, unspecified: Secondary | ICD-10-CM | POA: Diagnosis present

## 2016-10-14 DIAGNOSIS — Z4659 Encounter for fitting and adjustment of other gastrointestinal appliance and device: Secondary | ICD-10-CM | POA: Diagnosis not present

## 2016-10-14 DIAGNOSIS — M264 Malocclusion, unspecified: Secondary | ICD-10-CM | POA: Diagnosis not present

## 2016-10-14 DIAGNOSIS — R0602 Shortness of breath: Secondary | ICD-10-CM | POA: Diagnosis not present

## 2016-10-14 DIAGNOSIS — R21 Rash and other nonspecific skin eruption: Secondary | ICD-10-CM | POA: Diagnosis not present

## 2016-10-14 DIAGNOSIS — Z6830 Body mass index (BMI) 30.0-30.9, adult: Secondary | ICD-10-CM | POA: Diagnosis not present

## 2016-10-14 DIAGNOSIS — M2602 Maxillary hypoplasia: Secondary | ICD-10-CM | POA: Diagnosis not present

## 2016-10-14 DIAGNOSIS — J9691 Respiratory failure, unspecified with hypoxia: Secondary | ICD-10-CM | POA: Diagnosis not present

## 2016-10-14 DIAGNOSIS — R040 Epistaxis: Secondary | ICD-10-CM | POA: Diagnosis not present

## 2016-10-14 DIAGNOSIS — J988 Other specified respiratory disorders: Secondary | ICD-10-CM | POA: Diagnosis not present

## 2016-10-14 DIAGNOSIS — J9601 Acute respiratory failure with hypoxia: Secondary | ICD-10-CM | POA: Diagnosis not present

## 2016-10-14 DIAGNOSIS — M2669 Other specified disorders of temporomandibular joint: Secondary | ICD-10-CM | POA: Diagnosis not present

## 2016-10-14 DIAGNOSIS — D62 Acute posthemorrhagic anemia: Secondary | ICD-10-CM | POA: Diagnosis not present

## 2016-10-14 DIAGNOSIS — I10 Essential (primary) hypertension: Secondary | ICD-10-CM | POA: Diagnosis not present

## 2016-10-14 DIAGNOSIS — R739 Hyperglycemia, unspecified: Secondary | ICD-10-CM | POA: Diagnosis not present

## 2016-10-14 DIAGNOSIS — R1310 Dysphagia, unspecified: Secondary | ICD-10-CM | POA: Diagnosis not present

## 2016-10-14 DIAGNOSIS — G9341 Metabolic encephalopathy: Secondary | ICD-10-CM | POA: Diagnosis not present

## 2016-10-14 DIAGNOSIS — R51 Headache: Secondary | ICD-10-CM | POA: Diagnosis not present

## 2016-10-14 DIAGNOSIS — Z4682 Encounter for fitting and adjustment of non-vascular catheter: Secondary | ICD-10-CM | POA: Diagnosis not present

## 2016-10-14 DIAGNOSIS — I959 Hypotension, unspecified: Secondary | ICD-10-CM | POA: Diagnosis not present

## 2016-10-14 DIAGNOSIS — G9340 Encephalopathy, unspecified: Secondary | ICD-10-CM | POA: Diagnosis not present

## 2016-10-14 DIAGNOSIS — J9690 Respiratory failure, unspecified, unspecified whether with hypoxia or hypercapnia: Secondary | ICD-10-CM | POA: Diagnosis not present

## 2016-10-14 DIAGNOSIS — Z888 Allergy status to other drugs, medicaments and biological substances status: Secondary | ICD-10-CM | POA: Diagnosis not present

## 2016-10-14 NOTE — H&P (Signed)
Anna [name] is an 61 y.o. female.   Chief Complaint: unable to chew properly HPI: congenital  Past Medical History:  Diagnosis Date  . Anxiety   . Arthritis   . Diverticulitis   . Fibromyalgia   . Gluten intolerance   . Hypertension     Past Surgical History:  Procedure Laterality Date  . ABDOMINAL HYSTERECTOMY     uterus only    History reviewed. No pertinent family history. Social History:  reports that she has never smoked. She has never used smokeless tobacco. She reports that she drinks about 1.2 oz of alcohol per week . She reports that she does not use drugs.  Allergies:  Allergies  Allergen Reactions  . Glutethimides Other (See Comments)    Major stomach cramping  . Zyrtec [Cetirizine] Other (See Comments)    Anxiety     No prescriptions prior to admission.    No results found for this or any previous visit (from the past 48 hour(s)). No results found.  Review of Systems  Constitutional: Negative.   HENT: Negative.   Eyes: Negative.   Respiratory: Negative.   Cardiovascular: Negative.   Gastrointestinal: Negative.   Genitourinary: Negative.   Musculoskeletal: Negative.   Skin: Negative.   Neurological: Negative.   Endo/Heme/Allergies: Negative.   Psychiatric/Behavioral: Negative.   All other systems reviewed and are negative.   Height 5\' 5"  (1.651 m), weight 82.6 kg (182 lb). Physical Exam  Nursing note and vitals reviewed. Constitutional: She is oriented to person, place, and time. She appears well-developed and well-nourished.  HENT:  Head: Normocephalic and atraumatic.    Right Ear: External ear normal.  Left Ear: External ear normal.  Nose: Nose normal.  Mouth/Throat: Oropharynx is clear and moist and mucous membranes are normal.    Eyes: Pupils are equal, round, and reactive to light. EOM are normal.  Neck: Normal range of motion. Neck supple.  Cardiovascular: Normal rate, regular rhythm, normal heart sounds and intact distal  pulses.   Respiratory: Effort normal and breath sounds normal.  GI: Soft. Bowel sounds are normal.  Musculoskeletal: Normal range of motion.  Neurological: She is alert and oriented to person, place, and time. She has normal reflexes.  Skin: Skin is warm and dry.  Psychiatric: She has a normal mood and affect. Her behavior is normal. Judgment and thought content normal.     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

## 2016-10-14 NOTE — H&P (Signed)
This is a 62 y/o wd/wn white female,  Who presents with a significant malocclusion and functional deformity.  The proposed procedure is to correct the malocclusion and allow her to function normally.

## 2016-10-15 ENCOUNTER — Inpatient Hospital Stay (HOSPITAL_BASED_OUTPATIENT_CLINIC_OR_DEPARTMENT_OTHER)
Admission: AD | Admit: 2016-10-15 | Discharge: 2016-10-21 | DRG: 131 | Disposition: A | Discharge status: Home / Self Care | Payer: BLUE CROSS/BLUE SHIELD | Source: Ambulatory Visit | Attending: Family Medicine | Admitting: Family Medicine

## 2016-10-15 ENCOUNTER — Ambulatory Visit (HOSPITAL_BASED_OUTPATIENT_CLINIC_OR_DEPARTMENT_OTHER): Payer: BLUE CROSS/BLUE SHIELD | Admitting: Certified Registered"

## 2016-10-15 ENCOUNTER — Inpatient Hospital Stay (HOSPITAL_COMMUNITY): Payer: BLUE CROSS/BLUE SHIELD

## 2016-10-15 ENCOUNTER — Encounter (HOSPITAL_BASED_OUTPATIENT_CLINIC_OR_DEPARTMENT_OTHER): Payer: Self-pay | Admitting: Certified Registered"

## 2016-10-15 ENCOUNTER — Encounter (HOSPITAL_COMMUNITY)
Admission: AD | Disposition: A | Discharge status: Home / Self Care | Payer: Self-pay | Source: Ambulatory Visit | Attending: Emergency Medicine

## 2016-10-15 DIAGNOSIS — J9601 Acute respiratory failure with hypoxia: Secondary | ICD-10-CM | POA: Diagnosis not present

## 2016-10-15 DIAGNOSIS — I10 Essential (primary) hypertension: Secondary | ICD-10-CM

## 2016-10-15 DIAGNOSIS — Z4659 Encounter for fitting and adjustment of other gastrointestinal appliance and device: Secondary | ICD-10-CM

## 2016-10-15 DIAGNOSIS — M797 Fibromyalgia: Secondary | ICD-10-CM | POA: Diagnosis present

## 2016-10-15 DIAGNOSIS — J9691 Respiratory failure, unspecified with hypoxia: Secondary | ICD-10-CM | POA: Diagnosis present

## 2016-10-15 DIAGNOSIS — G9341 Metabolic encephalopathy: Secondary | ICD-10-CM | POA: Diagnosis not present

## 2016-10-15 DIAGNOSIS — I959 Hypotension, unspecified: Secondary | ICD-10-CM | POA: Diagnosis not present

## 2016-10-15 DIAGNOSIS — R1310 Dysphagia, unspecified: Secondary | ICD-10-CM

## 2016-10-15 DIAGNOSIS — K9041 Non-celiac gluten sensitivity: Secondary | ICD-10-CM | POA: Diagnosis present

## 2016-10-15 DIAGNOSIS — F419 Anxiety disorder, unspecified: Secondary | ICD-10-CM | POA: Diagnosis present

## 2016-10-15 DIAGNOSIS — G9340 Encephalopathy, unspecified: Secondary | ICD-10-CM | POA: Diagnosis not present

## 2016-10-15 DIAGNOSIS — M264 Malocclusion, unspecified: Secondary | ICD-10-CM | POA: Diagnosis present

## 2016-10-15 DIAGNOSIS — J988 Other specified respiratory disorders: Secondary | ICD-10-CM | POA: Diagnosis not present

## 2016-10-15 DIAGNOSIS — D62 Acute posthemorrhagic anemia: Secondary | ICD-10-CM | POA: Diagnosis not present

## 2016-10-15 DIAGNOSIS — R739 Hyperglycemia, unspecified: Secondary | ICD-10-CM | POA: Diagnosis not present

## 2016-10-15 DIAGNOSIS — E669 Obesity, unspecified: Secondary | ICD-10-CM | POA: Diagnosis present

## 2016-10-15 DIAGNOSIS — J9811 Atelectasis: Secondary | ICD-10-CM | POA: Diagnosis not present

## 2016-10-15 DIAGNOSIS — J90 Pleural effusion, not elsewhere classified: Secondary | ICD-10-CM | POA: Diagnosis not present

## 2016-10-15 DIAGNOSIS — T884XXA Failed or difficult intubation, initial encounter: Secondary | ICD-10-CM

## 2016-10-15 DIAGNOSIS — R0902 Hypoxemia: Secondary | ICD-10-CM | POA: Diagnosis not present

## 2016-10-15 DIAGNOSIS — R51 Headache: Secondary | ICD-10-CM | POA: Diagnosis not present

## 2016-10-15 DIAGNOSIS — Z6830 Body mass index (BMI) 30.0-30.9, adult: Secondary | ICD-10-CM | POA: Diagnosis not present

## 2016-10-15 DIAGNOSIS — Z888 Allergy status to other drugs, medicaments and biological substances status: Secondary | ICD-10-CM | POA: Diagnosis not present

## 2016-10-15 DIAGNOSIS — J9690 Respiratory failure, unspecified, unspecified whether with hypoxia or hypercapnia: Secondary | ICD-10-CM | POA: Diagnosis not present

## 2016-10-15 DIAGNOSIS — M2602 Maxillary hypoplasia: Principal | ICD-10-CM | POA: Diagnosis present

## 2016-10-15 DIAGNOSIS — Z4682 Encounter for fitting and adjustment of non-vascular catheter: Secondary | ICD-10-CM | POA: Diagnosis not present

## 2016-10-15 DIAGNOSIS — R21 Rash and other nonspecific skin eruption: Secondary | ICD-10-CM | POA: Diagnosis not present

## 2016-10-15 DIAGNOSIS — T380X5A Adverse effect of glucocorticoids and synthetic analogues, initial encounter: Secondary | ICD-10-CM | POA: Diagnosis not present

## 2016-10-15 DIAGNOSIS — M2669 Other specified disorders of temporomandibular joint: Secondary | ICD-10-CM | POA: Diagnosis not present

## 2016-10-15 DIAGNOSIS — M26213 Malocclusion, Angle's class III: Secondary | ICD-10-CM | POA: Diagnosis not present

## 2016-10-15 DIAGNOSIS — R040 Epistaxis: Secondary | ICD-10-CM | POA: Diagnosis not present

## 2016-10-15 DIAGNOSIS — R0602 Shortness of breath: Secondary | ICD-10-CM | POA: Diagnosis not present

## 2016-10-15 HISTORY — DX: Diverticulitis of intestine, part unspecified, without perforation or abscess without bleeding: K57.92

## 2016-10-15 HISTORY — PX: MAXILLARY LE FORTE II OSTEOTOMY: SHX2007

## 2016-10-15 HISTORY — DX: Fibromyalgia: M79.7

## 2016-10-15 LAB — POCT I-STAT, CHEM 8
BUN: 21 mg/dL — AB (ref 6–20)
CHLORIDE: 106 mmol/L (ref 101–111)
CREATININE: 0.8 mg/dL (ref 0.44–1.00)
Calcium, Ion: 1.14 mmol/L — ABNORMAL LOW (ref 1.15–1.40)
Glucose, Bld: 204 mg/dL — ABNORMAL HIGH (ref 65–99)
HEMATOCRIT: 27 % — AB (ref 36.0–46.0)
HEMOGLOBIN: 9.2 g/dL — AB (ref 12.0–15.0)
POTASSIUM: 3.9 mmol/L (ref 3.5–5.1)
Sodium: 140 mmol/L (ref 135–145)
TCO2: 26 mmol/L (ref 0–100)

## 2016-10-15 LAB — GLUCOSE, CAPILLARY
GLUCOSE-CAPILLARY: 177 mg/dL — AB (ref 65–99)
GLUCOSE-CAPILLARY: 172 mg/dL — AB (ref 65–99)

## 2016-10-15 LAB — CBC
HEMATOCRIT: 28.1 % — AB (ref 36.0–46.0)
HEMOGLOBIN: 9.4 g/dL — AB (ref 12.0–15.0)
MCH: 30.9 pg (ref 26.0–34.0)
MCHC: 33.5 g/dL (ref 30.0–36.0)
MCV: 92.4 fL (ref 78.0–100.0)
Platelets: 227 10*3/uL (ref 150–400)
RBC: 3.04 MIL/uL — AB (ref 3.87–5.11)
RDW: 12.9 % (ref 11.5–15.5)
WBC: 18.3 10*3/uL — AB (ref 4.0–10.5)

## 2016-10-15 LAB — BLOOD GAS, ARTERIAL
Acid-base deficit: 2.5 mmol/L — ABNORMAL HIGH (ref 0.0–2.0)
BICARBONATE: 21.5 mmol/L (ref 20.0–28.0)
Drawn by: 51185
FIO2: 100
LHR: 18 {breaths}/min
O2 SAT: 99.6 %
PATIENT TEMPERATURE: 97.6
PEEP: 5 cmH2O
PH ART: 7.408 (ref 7.350–7.450)
VT: 460 mL
pCO2 arterial: 34.6 mmHg (ref 32.0–48.0)
pO2, Arterial: 313 mmHg — ABNORMAL HIGH (ref 83.0–108.0)

## 2016-10-15 LAB — APTT: aPTT: 26 seconds (ref 24–36)

## 2016-10-15 LAB — PROTIME-INR
INR: 1.22
Prothrombin Time: 15.5 seconds — ABNORMAL HIGH (ref 11.4–15.2)

## 2016-10-15 SURGERY — OSTEOTOMY, LE FORT II
Anesthesia: General | Site: Mouth

## 2016-10-15 MED ORDER — PROPOFOL 10 MG/ML IV BOLUS
INTRAVENOUS | Status: AC
  Filled 2016-10-15: qty 20

## 2016-10-15 MED ORDER — OXYMETAZOLINE HCL 0.05 % NA SOLN
NASAL | Status: AC
  Filled 2016-10-15: qty 15

## 2016-10-15 MED ORDER — OXYMETAZOLINE HCL 0.05 % NA SOLN
NASAL | Status: DC | PRN
  Administered 2016-10-15: 2 via NASAL

## 2016-10-15 MED ORDER — FENTANYL CITRATE (PF) 100 MCG/2ML IJ SOLN
INTRAMUSCULAR | Status: AC
  Filled 2016-10-15: qty 2

## 2016-10-15 MED ORDER — MIDAZOLAM HCL 2 MG/2ML IJ SOLN: 1.0000 mg | INTRAMUSCULAR | Status: DC | PRN

## 2016-10-15 MED ORDER — OXYCODONE HCL 5 MG/5ML PO SOLN: 5.0000 mg | Freq: Once | ORAL | Status: DC | PRN

## 2016-10-15 MED ORDER — MIDAZOLAM HCL 2 MG/2ML IJ SOLN
1.0000 mg | INTRAMUSCULAR | Status: DC | PRN
  Administered 2016-10-15: 1 mg via INTRAVENOUS

## 2016-10-15 MED ORDER — DEXAMETHASONE SODIUM PHOSPHATE 10 MG/ML IJ SOLN
INTRAMUSCULAR | Status: DC | PRN
  Administered 2016-10-15: 10 mg via INTRAVENOUS

## 2016-10-15 MED ORDER — COCAINE HCL 4 % EX SOLN
CUTANEOUS | Status: DC | PRN
  Administered 2016-10-15: 4 mL via NASAL

## 2016-10-15 MED ORDER — CHLORHEXIDINE GLUCONATE 0.12% ORAL RINSE (MEDLINE KIT)
15.0000 mL | Freq: Two times a day (BID) | OROMUCOSAL | Status: DC
  Administered 2016-10-15 – 2016-10-20 (×9): 15 mL via OROMUCOSAL

## 2016-10-15 MED ORDER — DEXMEDETOMIDINE HCL IN NACL 200 MCG/50ML IV SOLN
0.4000 ug/kg/h | INTRAVENOUS | Status: DC
  Administered 2016-10-15 (×2): 0.6 ug/kg/h via INTRAVENOUS
  Administered 2016-10-15: 0.4 ug/kg/h via INTRAVENOUS
  Filled 2016-10-15 (×3): qty 50

## 2016-10-15 MED ORDER — NEOSTIGMINE METHYLSULFATE 10 MG/10ML IV SOLN
INTRAVENOUS | Status: DC | PRN
  Administered 2016-10-15: 3 mg via INTRAVENOUS

## 2016-10-15 MED ORDER — MIDAZOLAM HCL 5 MG/5ML IJ SOLN
INTRAMUSCULAR | Status: DC | PRN
  Administered 2016-10-15 (×2): 2 mg via INTRAVENOUS

## 2016-10-15 MED ORDER — ROCURONIUM BROMIDE 10 MG/ML (PF) SYRINGE
PREFILLED_SYRINGE | INTRAVENOUS | Status: DC | PRN
  Administered 2016-10-15: 40 mg via INTRAVENOUS

## 2016-10-15 MED ORDER — SCOPOLAMINE 1 MG/3DAYS TD PT72
1.0000 | MEDICATED_PATCH | Freq: Once | TRANSDERMAL | Status: AC | PRN
  Administered 2016-10-15: 1 via TRANSDERMAL

## 2016-10-15 MED ORDER — LIDOCAINE-EPINEPHRINE 2 %-1:100000 IJ SOLN
INTRAMUSCULAR | Status: DC | PRN
  Administered 2016-10-15: 8.5 mL

## 2016-10-15 MED ORDER — FENTANYL CITRATE (PF) 100 MCG/2ML IJ SOLN
50.0000 ug | INTRAMUSCULAR | Status: DC | PRN
  Administered 2016-10-15: 50 ug via INTRAVENOUS

## 2016-10-15 MED ORDER — COCAINE HCL 4 % EX SOLN
CUTANEOUS | Status: AC
  Filled 2016-10-15: qty 4

## 2016-10-15 MED ORDER — INSULIN ASPART 100 UNIT/ML ~~LOC~~ SOLN
0.0000 [IU] | SUBCUTANEOUS | Status: DC
  Administered 2016-10-16 (×2): 3 [IU] via SUBCUTANEOUS
  Administered 2016-10-16 (×2): 2 [IU] via SUBCUTANEOUS
  Administered 2016-10-16 (×2): 3 [IU] via SUBCUTANEOUS
  Administered 2016-10-17 (×2): 2 [IU] via SUBCUTANEOUS
  Administered 2016-10-17: 3 [IU] via SUBCUTANEOUS

## 2016-10-15 MED ORDER — FENTANYL 2500MCG IN NS 250ML (10MCG/ML) PREMIX INFUSION
25.0000 ug/h | INTRAVENOUS | Status: DC
  Administered 2016-10-15: 50 ug/h via INTRAVENOUS
  Administered 2016-10-16: 100 ug/h via INTRAVENOUS
  Administered 2016-10-16: 125 ug/h via INTRAVENOUS
  Filled 2016-10-15 (×3): qty 250

## 2016-10-15 MED ORDER — VANCOMYCIN HCL 10 G IV SOLR
1500.0000 mg | Freq: Once | INTRAVENOUS | Status: AC
  Administered 2016-10-15: 1500 mg via INTRAVENOUS
  Filled 2016-10-15: qty 1500

## 2016-10-15 MED ORDER — LABETALOL HCL 5 MG/ML IV SOLN
INTRAVENOUS | Status: DC | PRN
  Administered 2016-10-15 (×4): 5 mg via INTRAVENOUS

## 2016-10-15 MED ORDER — OXYCODONE HCL 5 MG PO TABS: 5.0000 mg | ORAL_TABLET | Freq: Once | ORAL | Status: DC | PRN

## 2016-10-15 MED ORDER — FENTANYL CITRATE (PF) 250 MCG/5ML IJ SOLN
INTRAMUSCULAR | Status: DC | PRN
Start: 2016-10-15 — End: 2016-10-15
  Administered 2016-10-15: 50 ug via INTRAVENOUS
  Administered 2016-10-15: 100 ug via INTRAVENOUS
  Administered 2016-10-15: 50 ug via INTRAVENOUS

## 2016-10-15 MED ORDER — ETOMIDATE 2 MG/ML IV SOLN
INTRAVENOUS | Status: AC
  Filled 2016-10-15: qty 10

## 2016-10-15 MED ORDER — MIDAZOLAM HCL 2 MG/2ML IJ SOLN
2.0000 mg | INTRAMUSCULAR | Status: DC | PRN
Start: 2016-10-15 — End: 2016-10-17
  Administered 2016-10-16 – 2016-10-17 (×3): 2 mg via INTRAVENOUS
  Filled 2016-10-15 (×4): qty 2

## 2016-10-15 MED ORDER — MIDAZOLAM HCL 2 MG/2ML IJ SOLN
INTRAMUSCULAR | Status: AC
Start: 2016-10-15 — End: 2016-10-15
  Filled 2016-10-15: qty 2

## 2016-10-15 MED ORDER — DEXMEDETOMIDINE HCL IN NACL 200 MCG/50ML IV SOLN
INTRAVENOUS | Status: AC
  Filled 2016-10-15: qty 50

## 2016-10-15 MED ORDER — BUPIVACAINE-EPINEPHRINE (PF) 0.5% -1:200000 IJ SOLN
INTRAMUSCULAR | Status: AC
  Filled 2016-10-15: qty 30

## 2016-10-15 MED ORDER — LIDOCAINE-EPINEPHRINE 2 %-1:100000 IJ SOLN
INTRAMUSCULAR | Status: AC
  Filled 2016-10-15: qty 10.2

## 2016-10-15 MED ORDER — NEOSTIGMINE METHYLSULFATE 5 MG/5ML IV SOSY
PREFILLED_SYRINGE | INTRAVENOUS | Status: AC
  Filled 2016-10-15: qty 5

## 2016-10-15 MED ORDER — HYDROMORPHONE HCL 1 MG/ML IJ SOLN: 0.2500 mg | INTRAMUSCULAR | Status: DC | PRN

## 2016-10-15 MED ORDER — LABETALOL HCL 5 MG/ML IV SOLN
INTRAVENOUS | Status: AC
  Filled 2016-10-15: qty 4

## 2016-10-15 MED ORDER — SCOPOLAMINE 1 MG/3DAYS TD PT72
MEDICATED_PATCH | TRANSDERMAL | Status: AC
  Filled 2016-10-15: qty 1

## 2016-10-15 MED ORDER — SODIUM CHLORIDE 0.9 % IV SOLN: 25.0000 ug/h | INTRAVENOUS | Status: DC

## 2016-10-15 MED ORDER — SUCCINYLCHOLINE CHLORIDE 200 MG/10ML IV SOSY
PREFILLED_SYRINGE | INTRAVENOUS | Status: AC
  Filled 2016-10-15: qty 10

## 2016-10-15 MED ORDER — LACTATED RINGERS IV SOLN
INTRAVENOUS | Status: DC
  Administered 2016-10-15 (×3): via INTRAVENOUS

## 2016-10-15 MED ORDER — MIDAZOLAM HCL 2 MG/2ML IJ SOLN
0.5000 mg | INTRAMUSCULAR | Status: AC | PRN
  Administered 2016-10-15 (×4): 0.5 mg via INTRAVENOUS

## 2016-10-15 MED ORDER — ONDANSETRON HCL 4 MG/2ML IJ SOLN
INTRAMUSCULAR | Status: DC | PRN
  Administered 2016-10-15: 4 mg via INTRAVENOUS

## 2016-10-15 MED ORDER — BUPIVACAINE-EPINEPHRINE (PF) 0.5% -1:200000 IJ SOLN
INTRAMUSCULAR | Status: AC
Start: 2016-10-15 — End: 2016-10-15
  Filled 2016-10-15: qty 10.8

## 2016-10-15 MED ORDER — GLYCOPYRROLATE 0.2 MG/ML IJ SOLN
INTRAMUSCULAR | Status: DC | PRN
  Administered 2016-10-15: 0.4 mg via INTRAVENOUS

## 2016-10-15 MED ORDER — MIDAZOLAM HCL 2 MG/2ML IJ SOLN
2.0000 mg | INTRAMUSCULAR | Status: DC | PRN
  Administered 2016-10-15: 1 mg via INTRAVENOUS

## 2016-10-15 MED ORDER — ALBUMIN HUMAN 5 % IV SOLN
INTRAVENOUS | Status: DC | PRN
  Administered 2016-10-15 (×2): via INTRAVENOUS

## 2016-10-15 MED ORDER — ONDANSETRON HCL 4 MG/2ML IJ SOLN
INTRAMUSCULAR | Status: AC
  Filled 2016-10-15: qty 2

## 2016-10-15 MED ORDER — DEXAMETHASONE SODIUM PHOSPHATE 10 MG/ML IJ SOLN
10.0000 mg | Freq: Four times a day (QID) | INTRAMUSCULAR | Status: AC
  Administered 2016-10-15 – 2016-10-16 (×4): 10 mg via INTRAVENOUS
  Filled 2016-10-15 (×4): qty 1

## 2016-10-15 MED ORDER — PHENYLEPHRINE 40 MCG/ML (10ML) SYRINGE FOR IV PUSH (FOR BLOOD PRESSURE SUPPORT)
PREFILLED_SYRINGE | INTRAVENOUS | Status: DC | PRN
  Administered 2016-10-15: 40 ug via INTRAVENOUS

## 2016-10-15 MED ORDER — DEXMEDETOMIDINE HCL IN NACL 400 MCG/100ML IV SOLN
0.4000 ug/kg/h | INTRAVENOUS | Status: DC
  Administered 2016-10-15 – 2016-10-16 (×2): 0.8 ug/kg/h via INTRAVENOUS
  Administered 2016-10-16: 0.6 ug/kg/h via INTRAVENOUS
  Administered 2016-10-16: 0.8 ug/kg/h via INTRAVENOUS
  Administered 2016-10-17 (×2): 0.9 ug/kg/h via INTRAVENOUS
  Filled 2016-10-15 (×5): qty 100

## 2016-10-15 MED ORDER — PROMETHAZINE HCL 25 MG/ML IJ SOLN: 6.2500 mg | INTRAMUSCULAR | Status: DC | PRN

## 2016-10-15 MED ORDER — CEFAZOLIN SODIUM-DEXTROSE 2-4 GM/100ML-% IV SOLN
INTRAVENOUS | Status: AC
  Filled 2016-10-15: qty 100

## 2016-10-15 MED ORDER — FENTANYL CITRATE (PF) 100 MCG/2ML IJ SOLN
50.0000 ug | Freq: Once | INTRAMUSCULAR | Status: AC
  Administered 2016-10-15: 50 ug via INTRAVENOUS

## 2016-10-15 MED ORDER — PIPERACILLIN-TAZOBACTAM 3.375 G IVPB 30 MIN
3.3750 g | Freq: Once | INTRAVENOUS | Status: AC
  Administered 2016-10-15: 3.375 g via INTRAVENOUS
  Filled 2016-10-15: qty 50

## 2016-10-15 MED ORDER — FENTANYL BOLUS VIA INFUSION
50.0000 ug | INTRAVENOUS | Status: DC | PRN
  Administered 2016-10-16: 50 ug via INTRAVENOUS
  Filled 2016-10-15: qty 50

## 2016-10-15 MED ORDER — LIDOCAINE-EPINEPHRINE 2 %-1:100000 IJ SOLN
INTRAMUSCULAR | Status: AC
  Filled 2016-10-15: qty 5.1

## 2016-10-15 MED ORDER — FENTANYL CITRATE (PF) 100 MCG/2ML IJ SOLN
INTRAMUSCULAR | Status: AC
Start: 2016-10-15 — End: 2016-10-15
  Filled 2016-10-15: qty 2

## 2016-10-15 MED ORDER — CEFAZOLIN SODIUM-DEXTROSE 2-4 GM/100ML-% IV SOLN
2.0000 g | INTRAVENOUS | Status: AC
  Administered 2016-10-15: 2 g via INTRAVENOUS

## 2016-10-15 MED ORDER — BACITRACIN ZINC 500 UNIT/GM EX OINT
TOPICAL_OINTMENT | CUTANEOUS | Status: AC
  Filled 2016-10-15: qty 1.8

## 2016-10-15 MED ORDER — LIDOCAINE 2% (20 MG/ML) 5 ML SYRINGE
INTRAMUSCULAR | Status: AC
  Filled 2016-10-15: qty 5

## 2016-10-15 MED ORDER — MIDAZOLAM HCL 2 MG/2ML IJ SOLN
INTRAMUSCULAR | Status: AC
  Filled 2016-10-15: qty 2

## 2016-10-15 MED ORDER — EPHEDRINE 5 MG/ML INJ
INTRAVENOUS | Status: AC
  Filled 2016-10-15: qty 10

## 2016-10-15 MED ORDER — PHENYLEPHRINE 40 MCG/ML (10ML) SYRINGE FOR IV PUSH (FOR BLOOD PRESSURE SUPPORT)
PREFILLED_SYRINGE | INTRAVENOUS | Status: AC
  Filled 2016-10-15: qty 10

## 2016-10-15 MED ORDER — PANTOPRAZOLE SODIUM 40 MG IV SOLR
40.0000 mg | Freq: Every day | INTRAVENOUS | Status: DC
  Administered 2016-10-16 – 2016-10-17 (×2): 40 mg via INTRAVENOUS
  Filled 2016-10-15 (×3): qty 40

## 2016-10-15 MED ORDER — ORAL CARE MOUTH RINSE
15.0000 mL | Freq: Four times a day (QID) | OROMUCOSAL | Status: DC
  Administered 2016-10-16 – 2016-10-19 (×13): 15 mL via OROMUCOSAL

## 2016-10-15 MED ORDER — MEPERIDINE HCL 25 MG/ML IJ SOLN: 6.2500 mg | INTRAMUSCULAR | Status: DC | PRN

## 2016-10-15 MED ORDER — EPHEDRINE SULFATE-NACL 50-0.9 MG/10ML-% IV SOSY
PREFILLED_SYRINGE | INTRAVENOUS | Status: DC | PRN
  Administered 2016-10-15 (×3): 5 mg via INTRAVENOUS

## 2016-10-15 MED ORDER — PROPOFOL 10 MG/ML IV BOLUS
INTRAVENOUS | Status: DC | PRN
  Administered 2016-10-15 (×2): 40 mg via INTRAVENOUS
  Administered 2016-10-15: 160 mg via INTRAVENOUS

## 2016-10-15 MED ORDER — FENTANYL CITRATE (PF) 100 MCG/2ML IJ SOLN: 50.0000 ug | INTRAMUSCULAR | Status: DC | PRN

## 2016-10-15 MED ORDER — FENTANYL CITRATE (PF) 100 MCG/2ML IJ SOLN
50.0000 ug | INTRAMUSCULAR | Status: DC | PRN
  Administered 2016-10-15: 50 ug via INTRAVENOUS
  Administered 2016-10-15: 100 ug via INTRAVENOUS

## 2016-10-15 MED ORDER — DEXAMETHASONE SODIUM PHOSPHATE 10 MG/ML IJ SOLN
INTRAMUSCULAR | Status: AC
  Filled 2016-10-15: qty 1

## 2016-10-15 MED ORDER — OXYMETAZOLINE HCL 0.05 % NA SOLN
NASAL | Status: DC | PRN
  Administered 2016-10-15: 1

## 2016-10-15 MED ORDER — LIDOCAINE 2% (20 MG/ML) 5 ML SYRINGE
INTRAMUSCULAR | Status: DC | PRN
  Administered 2016-10-15: 80 mg via INTRAVENOUS

## 2016-10-15 SURGICAL SUPPLY — 58 items
ATTRACTOMAT 16X20 MAGNETIC DRP (DRAPES) ×2 IMPLANT
BLADE RECIP 26.3 (BLADE) ×2 IMPLANT
BLADE RECIP LONG 26.3 (BLADE) IMPLANT
BLADE SURG 15 STRL LF DISP TIS (BLADE) ×2 IMPLANT
BLADE SURG 15 STRL SS (BLADE) ×2
BUR 701 1.2X59 5PK (BURR) IMPLANT
BUR 702 1.6X59 5PK (BURR) IMPLANT
BUR 8 ROUND 2.3X65 5PK (BURR) IMPLANT
BUR OVAL 4X69 STERILE (BURR) IMPLANT
CANISTER SUCT 1200ML W/VALVE (MISCELLANEOUS) ×4 IMPLANT
COVER BACK TABLE 60X90IN (DRAPES) ×2 IMPLANT
COVER MAYO STAND STRL (DRAPES) ×4 IMPLANT
CUBES CANC 20CC CANCUBE1/4 (Bone Implant) ×2 IMPLANT
DRAPE U-SHAPE 76X120 STRL (DRAPES) ×2 IMPLANT
GAUZE PACKING FOLDED 2  STR (GAUZE/BANDAGES/DRESSINGS)
GAUZE PACKING FOLDED 2 STR (GAUZE/BANDAGES/DRESSINGS) IMPLANT
GAUZE PACKING IODOFORM 1/4X15 (GAUZE/BANDAGES/DRESSINGS) IMPLANT
GLOVE BIO SURGEON STRL SZ 6.5 (GLOVE) ×6 IMPLANT
GLOVE BIO SURGEON STRL SZ7.5 (GLOVE) ×6 IMPLANT
GLOVE ORTHO TXT STRL SZ7.5 (GLOVE) ×2 IMPLANT
GOWN STRL REUS W/ TWL LRG LVL3 (GOWN DISPOSABLE) ×3 IMPLANT
GOWN STRL REUS W/ TWL XL LVL3 (GOWN DISPOSABLE) ×2 IMPLANT
GOWN STRL REUS W/TWL LRG LVL3 (GOWN DISPOSABLE) ×3
GOWN STRL REUS W/TWL XL LVL3 (GOWN DISPOSABLE) ×2
IV NS 1000ML (IV SOLUTION) ×1
IV NS 1000ML BAXH (IV SOLUTION) ×1 IMPLANT
NEEDLE DENTAL 27 LONG (NEEDLE) ×2 IMPLANT
NS IRRIG 1000ML POUR BTL (IV SOLUTION) ×2 IMPLANT
PACK BASIN DAY SURGERY FS (CUSTOM PROCEDURE TRAY) ×2 IMPLANT
PLATE MID FACE 5H 5MM 100D LFT (Plate) ×2 IMPLANT
PLATE MID FACE 5H 5MM 100D RT (Plate) ×2 IMPLANT
PLATE MID FACE 6H 12M 100D RT (Plate) ×2 IMPLANT
PLATE MID FACE 6H 8MM 100D LFT (Plate) ×2 IMPLANT
RUBBERBAND STERILE (MISCELLANEOUS) IMPLANT
SCISSORS WIRE ANG 4 3/4 DISP (INSTRUMENTS) ×2 IMPLANT
SCREW MID FACE 1.7X5MM SLF TAP (Screw) ×40 IMPLANT
SCREW MID FACE 1.7X6MM SLF TAP (Screw) ×4 IMPLANT
SLEEVE SCD COMPRESS KNEE MED (MISCELLANEOUS) ×2 IMPLANT
STRIP CLOSURE SKIN 1/2X4 (GAUZE/BANDAGES/DRESSINGS) IMPLANT
SUT CHROMIC 3 0 PS 2 (SUTURE) ×2 IMPLANT
SUT CHROMIC 4 0 P 3 18 (SUTURE) IMPLANT
SUT SILK 3 0 PS 1 (SUTURE) IMPLANT
SUT STEEL 0 (SUTURE) ×1
SUT STEEL 0 18XMFL TIE 17 (SUTURE) ×1 IMPLANT
SUT STEEL 2 (SUTURE) IMPLANT
SUT STEEL 2 0 (SUTURE) IMPLANT
SUT VIC AB 2-0 PS2 27 (SUTURE) ×2 IMPLANT
SUT VICRYL 4-0 PS2 18IN ABS (SUTURE) ×10 IMPLANT
SYR 50ML LL SCALE MARK (SYRINGE) ×4 IMPLANT
TOOTHBRUSH ADULT (PERSONAL CARE ITEMS) ×2 IMPLANT
TOWEL OR 17X24 6PK STRL BLUE (TOWEL DISPOSABLE) ×4 IMPLANT
TOWEL OR NON WOVEN STRL DISP B (DISPOSABLE) ×2 IMPLANT
TRAY FOLEY BAG SILVER LF 14FR (SET/KITS/TRAYS/PACK) IMPLANT
TRAY FOLEY BAG SILVER LF 16FR (SET/KITS/TRAYS/PACK) IMPLANT
TUBE CONNECTING 20X1/4 (TUBING) ×2 IMPLANT
TUBE SALEM SUMP 16 FR W/ARV (TUBING) ×2 IMPLANT
VENT IRR SPI W TUB AD (MISCELLANEOUS) ×2 IMPLANT
YANKAUER SUCT BULB TIP NO VENT (SUCTIONS) ×2 IMPLANT

## 2016-10-15 NOTE — Progress Notes (Signed)
Pt's cuff re-inflated and pt placed on vent CPAP/PS 10/5 per Dr. Blane Ohara request.  No cuff leak heard at this time, pt's returned VT 715.  CCM has been consulted to determine airway protection.

## 2016-10-15 NOTE — Progress Notes (Signed)
Pharmacy Antibiotic Note  Anna [name] is a 61 y.o. female admitted on 10/15/2016 after planned oral surgery as outpatient. Pt had significant bleeding and airway edema peri-operatively and remained intubated after the completion of the procedure. Planning to start empiric antibiotics. WBC 18.3, SCr 1, eCrCl ~ 75-80 ml/min. No cultures have been drawn at this time.  Plan: -Vancomycin 1500 mg IV x1 then 1g/12h -Zosyn 3.375 g IV q8h -Monitor renal fx, cultures, obtain VT as needed  Height: 5\' 5"  (165.1 cm) Weight: 182 lb 9.6 oz (82.8 kg) IBW/kg (Calculated) : 57  Temp (24hrs), Avg:97.9 F (36.6 C), Min:97.3 F (36.3 C), Max:98.4 F (36.9 C)   Recent Labs Lab 10/15/16 1119 10/15/16 1331  WBC  --  18.3*  CREATININE 0.80  --       Allergies  Allergen Reactions  . Cymbalta [Duloxetine Hcl]     Anxiety   . Zyrtec [Cetirizine] Other (See Comments)    Anxiety     Antimicrobials this admission: 7/25 vancomycin > 7/25 zosyn >  Dose adjustments this admission: N/A  Microbiology results: N/A    Harvel Quale 10/15/2016 4:43 PM

## 2016-10-15 NOTE — Progress Notes (Signed)
71ml of 4% Cocaine administered via pledge that was inserted into right nare with Dr. Therisa Doyne during ETT exchange.  [place]

## 2016-10-15 NOTE — Anesthesia Preprocedure Evaluation (Addendum)
Anesthesia Evaluation  Patient identified by MRN, date of birth, ID band Patient awake    Reviewed: Allergy & Precautions, NPO status , Patient's Chart, lab work & pertinent test results  Airway Mallampati: III  TM Distance: >3 FB Neck ROM: Full    Dental no notable dental hx. (+) Teeth Intact Orthodontic appliances Significant underbite:   Pulmonary neg pulmonary ROS,    Pulmonary exam normal breath sounds clear to auscultation       Cardiovascular hypertension, Pt. on medications Normal cardiovascular exam Rhythm:Regular Rate:Normal     Neuro/Psych  Headaches, Anxiety    GI/Hepatic Neg liver ROS, Hx/o Diverticulitis and gluten sensitivity   Endo/Other  Obesity  Renal/GU negative Renal ROS  negative genitourinary   Musculoskeletal  (+) Arthritis , Fibromyalgia -Maxillary hypoplasia Malocclusion of teeth TMJ dysfunction   Abdominal (+) + obese,   Peds  Hematology   Anesthesia Other Findings   Reproductive/Obstetrics                            Anesthesia Physical Anesthesia Plan  ASA: II  Anesthesia Plan: General   Post-op Pain Management:    Induction: Intravenous  PONV Risk Score and Plan: 4 or greater and Ondansetron, Dexamethasone, Propofol, Midazolam, Scopolamine patch - Pre-op, Treatment may vary due to age or medical condition and Promethazine  Airway Management Planned: Oral ETT and Nasal ETT  Additional Equipment:   Intra-op Plan:   Post-operative Plan: Extubation in OR  Informed Consent: I have reviewed the patients History and Physical, chart, labs and discussed the procedure including the risks, benefits and alternatives for the proposed anesthesia with the patient or authorized representative who has indicated his/her understanding and acceptance.   Dental advisory given  Plan Discussed with: CRNA, Anesthesiologist and Surgeon  Anesthesia Plan Comments:          Anesthesia Quick Evaluation

## 2016-10-15 NOTE — Brief Op Note (Signed)
10/15/2016  10:41 AM  PATIENT:  Manley Mason  61 y.o. female  PRE-OPERATIVE DIAGNOSIS:  MAXILLARY HYPOPLASIA, MALOCCLUSION, ANGLES CLASS 3, OTHER SPECIFIED TMJ DISORDER AND HEADACHE  POST-OPERATIVE DIAGNOSIS:  MAXILLARY HYPOPLASIA, MALOCCLUSION, ANGLES CLASS 3, OTHER SPECIFIED TMJ DISORDER AND HEADACHE  PROCEDURE:  Procedure(s): LEFORTE 1 MAXILLARY OSTEOTOMY WITH ADVANCEMENT AND GRAFT (N/A)  SURGEON:  Surgeon(s) and Role:    Sabra Heck, Jeral Fruit, DDS - Primary    * Drab, Justin, DMD - Assisting  PHYSICIAN ASSISTANT:   ASSISTANTS: Jilda Panda, Kathaleen Grinder, Melanie Compton   ANESTHESIA:   general  EBL:  Total I/O In: 2500 [I.V.:2000; IV Piggyback:500] Out: 950 [Blood:950]  BLOOD ADMINISTERED:none  DRAINS: none   LOCAL MEDICATIONS USED:  XYLOCAINE   SPECIMEN:  No Specimen  DISPOSITION OF SPECIMEN:  N/A  COUNTS:  YES  TOURNIQUET:  * No tourniquets in log *  DICTATION: .Dragon Dictation  PLAN OF CARE: Admit for overnight observation  PATIENT DISPOSITION:  PACU - hemodynamically stable.   Delay start of Pharmacological VTE agent (>24hrs) due to surgical blood loss or risk of bleeding: not applicable

## 2016-10-15 NOTE — OR Nursing (Signed)
Patient transferred to [facility] PACU from [facility] for continued observation and assessment of post-operative hemorrage.  Sent with (3) 1/2" surgical patties w/cocaine 4% topical solution as packing in nose per Dr. Sabra Heck.  Report given to Mickel Baas in PACU per Leona Carry, CRNA

## 2016-10-15 NOTE — Addendum Note (Signed)
Addendum  created 10/15/16 1621 by Roderic Palau, MD   Sign clinical note

## 2016-10-15 NOTE — Progress Notes (Signed)
[place] Progress Note Patient Name: Anna [name] DOB: June 15, 1955 MRN: [id number]   Date of Service  10/15/2016  HPI/Events of Note    eICU Interventions  Foley catheter placed     Intervention Category Minor Interventions: Routine modifications to care plan (e.g. PRN medications for pain, fever)  Vena Bassinger S. 10/15/2016, 4:14 PM

## 2016-10-15 NOTE — Anesthesia Procedure Notes (Addendum)
Procedure Name: Intubation Date/Time: 10/15/2016 7:47 AM Performed by: Myna Bright Pre-anesthesia Checklist: Patient identified, Emergency Drugs available, Suction available and Patient being monitored Patient Re-evaluated:Patient Re-evaluated prior to induction Oxygen Delivery Method: Circle system utilized Preoxygenation: Pre-oxygenation with 100% oxygen Induction Type: IV induction Ventilation: Mask ventilation without difficulty Laryngoscope Size: Glidescope and 3 Nasal Tubes: Right, Magill forceps- large, utilized, Nasal prep performed and Nasal Rae Tube size: 7.0 mm Number of attempts: 2 Airway Equipment and Method: Video-laryngoscopy Placement Confirmation: ETT inserted through vocal cords under direct vision,  positive ETCO2 and breath sounds checked- equal and bilateral Tube secured with: Tape Dental Injury: Teeth and Oropharynx as per pre-operative assessment  Difficulty Due To: Difficulty was anticipated and Difficult Airway- due to anterior larynx Future Recommendations: Recommend- induction with short-acting agent, and alternative techniques readily available Comments: Easy mask airway. Glidescope used d/t anticipated anterior airway. CRNA unable to view cords on 1st attempt. Partial view of cords obtained by Dr. Royce Macadamia. Dr. Royce Macadamia used [place] to direct nasal tube anteriorly. Atraumatic nasal intubation. VSS.

## 2016-10-15 NOTE — Addendum Note (Signed)
Addendum  created 10/15/16 1504 by Myna Bright, CRNA   Anesthesia Intra Blocks edited, Sign clinical note

## 2016-10-15 NOTE — H&P (Signed)
Anesthesia update H&P reviewed. Okay for anesthesia and planned procedure.

## 2016-10-15 NOTE — Progress Notes (Addendum)
This note also relates to the following rows which could not be included: SpO2 - Cannot attach notes to unvalidated device data  Nasal intubation tube removed and replaced with an oral ETT by Dr. Ola Spurr.

## 2016-10-15 NOTE — Consult Note (Signed)
PULMONARY / CRITICAL CARE MEDICINE   Name: Anna [name] MRN: [id number] DOB: 12-24-1955    ADMISSION DATE:  10/15/2016 CONSULTATION DATE:  10/15/16  REFERRING MD:  Sabra Heck  CHIEF COMPLAINT:  Vent Management  HISTORY OF PRESENT ILLNESS:  Pt is encephelopathic; therefore, this HPI is obtained from chart review. Anna [name] is a 61 y.o. female with PMH as outlined below. She has been seen by Dr. Sabra Heck with dentristy for difficulty with chewing.  She was found to have significant malocclusion with functional deformity and it was recommended that she have a Lefort 1 maxillary advancement with bone graft performed.  She presented to day surgery 7/25 for planned surgery.  She required nasotracheal intubation and post procedure, had moderate bleeding from intubation site along with significant edema.  Possibly 1L blood loss. Due concerns for airway edema, she was transferred to [facility] for further evaluation and management.  While in PACU at [facility], she had nasotracheal tube replaced with oral tube due to cuff leak on nasotracheal tube. She then had nasal packing with cocaine impregnated gauze placed in bilateral nares.  She was then transferred to ICU.  PAST MEDICAL HISTORY :  She  has a past medical history of Anxiety; Arthritis; Diverticulitis; Fibromyalgia; Gluten intolerance; and Hypertension.  PAST SURGICAL HISTORY: She  has a past surgical history that includes Abdominal hysterectomy.  Allergies  Allergen Reactions  . Cymbalta [Duloxetine Hcl]     Anxiety   . Zyrtec [Cetirizine] Other (See Comments)    Anxiety     No current facility-administered medications on file prior to encounter.    Current Outpatient Prescriptions on File Prior to Encounter  Medication Sig  . ALPRAZolam (XANAX) 0.5 MG tablet Take 0.5 mg by mouth daily as needed for anxiety.  Marland Kitchen lisinopril-hydrochlorothiazide (PRINZIDE,ZESTORETIC) 20-12.5 MG tablet Take 1 tablet by mouth daily.    FAMILY HISTORY:  Her  indicated that her child is alive. She indicated that her other is alive.    SOCIAL HISTORY: She  reports that she has never smoked. She has never used smokeless tobacco. She reports that she drinks about 1.2 oz of alcohol per week . She reports that she does not use drugs.  REVIEW OF SYSTEMS:   Unable to obtain as pt is encephalopathic.  SUBJECTIVE:  On vent, unresponsive.  VITAL SIGNS: BP 115/72 (BP Location: Left Arm)   Pulse 90   Temp (!) 97.3 F (36.3 C)   Resp 19   Ht 5\' 5"  (1.651 m)   Wt 82.8 kg (182 lb 9.6 oz)   SpO2 98%   BMI 30.39 kg/m   HEMODYNAMICS:    VENTILATOR SETTINGS:    INTAKE / OUTPUT: No intake/output data recorded.  PHYSICAL EXAMINATION: General: Adult female, in NAD. Neuro: Sedated, non-responsive. HEENT: [place]/AT. PERRL, sclerae anicteric.  Nasotracheal tube in right nare with moderate bleeding noted. Cardiovascular: RRR, no M/R/G.  Lungs: Respirations even and unlabored.  CTA bilaterally, No W/R/R. Abdomen: BS x 4, soft, NT/ND.  Musculoskeletal: No gross deformities, no edema.  Skin: Intact, warm, no rashes.  LABS:  BMET  Recent Labs Lab 10/15/16 1119  NA 140  K 3.9  CL 106  BUN 21*  CREATININE 0.80  GLUCOSE 204*    Electrolytes No results for input(s): CALCIUM, MG, PHOS in the last 168 hours.  CBC  Recent Labs Lab 10/15/16 1119  HGB 9.2*  HCT 27.0*    Coag's No results for input(s): APTT, INR in the last 168 hours.  Sepsis  Markers No results for input(s): LATICACIDVEN, PROCALCITON, O2SATVEN in the last 168 hours.  ABG No results for input(s): PHART, PCO2ART, PO2ART in the last 168 hours.  Liver Enzymes No results for input(s): AST, ALT, ALKPHOS, BILITOT, ALBUMIN in the last 168 hours.  Cardiac Enzymes No results for input(s): TROPONINI, PROBNP in the last 168 hours.  Glucose No results for input(s): GLUCAP in the last 168 hours.  Imaging No results found.   STUDIES:  CXR 7/25 >    CULTURES: None.  ANTIBIOTICS: None.  SIGNIFICANT EVENTS: 7/25 > day surgery planned for Lefort I maxillary advancement with bone graft  LINES/TUBES: Right nasotracheal tube 7/25 > 7/25 ETT 7/25 >  L radial a-line 7/25>>>  DISCUSSION: 61 y.o. female F who went for Lefort I maxillary advancement with bone graft due to significant malocclusion with functional deformity.  Was nasotracheally intubated for procedure then developed cuff leak post procedure. Transferred to PACU at [facility] and had nasotracheal tube replaced with oral ETT.   ASSESSMENT / PLAN:  PULMONARY A: Respiratory insufficiency - unable to extubate post op due to nasal bleeding from nasotracheal tube so brought to [facility] and had nasal tube replaced with oral ETT. P:   Full vent support. Wean as able. VAP prevention measures. SBT only once bleeding controlled given risk of aspiration with continued bleeding. CXR in AM.  CARDIOVASCULAR A:  Hx HTN. P:  Monitor hemodynamics. Hold preadmission prinzide.  RENAL A:   Hypocalcemia. P:   1g Ca gluconate. NS @ 100. BMP in AM.  GASTROINTESTINAL A:   GI prophylaxis. Nutrition. P:   SUP: Pantoprazole. NPO.  HEMATOLOGIC A:   Anemia - expect further drop given blood loss intraop. VTE Prophylaxis. P:  Repeat CBC this PM. Transfuse for Hgb < 7. SCD's only. Assess coags now. CBC in AM.  INFECTIOUS A:   Empiric coverage given oral surgery + nasal packing. P:   Vanc +  Zosyn.  ENDOCRINE A:   No acute issues.   P:   No interventions required.  NEUROLOGIC A:   Acute encephalopathy - due to sedation. Hx anxiety, fibromyalgia. P:   Sedation:  Fentanyl gtt / Midazolam PRN. RASS goal: 0 to -1. Daily WUA. Hold preadmission alprazolam, nortriptyline.  MAXILLOFACIAL A: Significant malocclusion with functional deformity - s/p Lefort 1 maxillary advancement with bone graft performed 7/25. P: Post op care per DDS. Continue nasal packing with cocaine  impregnated gauze.\ Continue empiric vanc / zosyn.  Family updated: None available.  Interdisciplinary Family Meeting v Palliative Care Meeting:  Due by: 10/21/16.  CC time: 40 min.   Montey Hora, [place] [facility] Pager: [phone]  or [phone] 10/15/2016, 1:13 PM  Attending Note:  61 year old female with PMH above presenting to PCCM from OR to PACU with respiratory failure, upper airway bleeding and compromised airway.  PCCM was called for vent management and ICU management.  On exam, the patient was awake, weaning and vomiting with bloody airway and cuff leak.  No imaging was noted.  Along with assistance from the anesthesia team and extensive work in the PACU.  We were able to intubate orally with a glidescope to acquire a stable airway.  Resume full vent support.  ABG and coags and CBC now.  Bronchoscope used to verify location and assist with intubation.    The patient is critically ill with multiple organ systems failure and requires high complexity decision making for assessment  and support, frequent evaluation and titration of therapies, application of advanced monitoring technologies and extensive interpretation of multiple databases.   Critical Care Time devoted to patient care services described in this note is  120  Minutes. This time reflects time of care of this signee Dr Jennet Maduro. This critical care time does not reflect procedure time, or teaching time or supervisory time of PA/NP/Med student/Med Resident etc but could involve care discussion time.  Rush Farmer, M.D. [facility] Pulmonary/Critical Care Medicine. Pager: [phone]. After hours pager: [phone].

## 2016-10-15 NOTE — Progress Notes (Signed)
Patient was seen in PACU with nasal tracheal tube in place which had a cuff leak. There was also significant bleeding from the nose and mouth s/p maxillary osteotomy. After sedating the patient, I placed a glidescope into her mouth with an oral ETT. Under direct visualization, I had the nasal tube withdrawn and placed the oral ETT immediately into the trachea. BS were confirmed bilaterally. +EtCO2. The ICU attending also confirmed placement with bronchoscopy.

## 2016-10-15 NOTE — Anesthesia Procedure Notes (Signed)
Arterial Line Insertion Start/End7/25/2018 1:00 PM, 10/15/2016 1:12 PM Performed by: Mervyn Gay, CRNA  Patient location: PACU. Preanesthetic checklist: patient identified, IV checked, site marked, risks and benefits discussed, surgical consent, monitors and equipment checked, pre-op evaluation, timeout performed and anesthesia consent Lidocaine 1% used for infiltration Left, radial was placed Catheter size: 20 G Hand hygiene performed , maximum sterile barriers used  and Seldinger technique used Allen's test indicative of satisfactory collateral circulation Attempts: 1 Procedure performed without using ultrasound guided technique. Following insertion, dressing applied and Biopatch. Post procedure assessment: normal  Patient tolerated the procedure well with no immediate complications. Additional procedure comments: Placed in [place] [facility]). Dr Ola Spurr at bedside. Marland Kitchen

## 2016-10-15 NOTE — Anesthesia Postprocedure Evaluation (Signed)
Anesthesia Post Note  Patient: Anna [name]  Procedure(s) Performed: Procedure(s) (LRB): LEFORTE 1 MAXILLARY OSTEOTOMY WITH ADVANCEMENT AND GRAFT (N/A)     Patient location during evaluation: Other (Transferred to main hospital for further management and ICU care) Anesthesia Type: General Level of consciousness: patient cooperative, awake and responds to stimulation Pain management: pain level controlled Vital Signs Assessment: post-procedure vital signs reviewed and stable Respiratory status: spontaneous breathing, patient connected to T-piece oxygen and respiratory function unstable (Cuff leak, nasotracheal tube in place) Cardiovascular status: blood pressure returned to baseline and stable Postop Assessment: no signs of nausea or vomiting Anesthetic complications: no Comments: Patient had abnormal amount of bleeding from surgery, ? Underlying coagulopathy of unknown etiology.                  Preet Mangano A.

## 2016-10-15 NOTE — Procedures (Signed)
Bronchoscopy Procedure Note Anna [name] [id number] 1955/06/07  Procedure: Bronchoscopy Indications: Diagnostic evaluation of the airways  Procedure Details Consent: Unable to obtain consent because of emergent medical necessity. Time Out: Verified patient identification, verified procedure, site/side was marked, verified correct patient position, special equipment/implants available, medications/allergies/relevent history reviewed, required imaging and test results available.  Performed  In preparation for procedure, patient was given 100% FiO2 and bronchoscope lubricated. Sedation: Benzodiazepines, Muscle relaxants, Etomidate and Fentanyl  Airway entered and the following bronchi were examined: RUL, RML, RLL, LUL, LLL and Bronchi.   ETT verified bronchoscopically Bronchoscope removed.    Evaluation Hemodynamic Status: BP stable throughout; O2 sats: stable throughout Patient's Current Condition: stable Specimens:  None Complications: No apparent complications Patient did tolerate procedure well.   Jennet Maduro 10/15/2016

## 2016-10-15 NOTE — H&P (Signed)
The medical history was updated.  There was no changes.  The surgery was reviewed with the patient.  She will stay over in 23 hour obs.

## 2016-10-15 NOTE — Op Note (Signed)
Patient was brought to the operating room and placed in the supine position which remained throughout the whole procedure. She was then intubated by right nasoendotracheal tube. She was turned 90 to the anesthesia cart. She was then prepped with Betadine scrub and paint both intraorally and extraorally. The patient was then draped off in normal fashion for an orthographic procedure. 2% Xylocaine with 1-100,000 epinephrine was infiltrated into the right and left maxilla and posterior maxilla. With retractors in place incision was made from the maxillary first molar #14 extending to #6. A full-thickness buccal flap was elevated with periosteal elevator and Hanahan. The dissection was carried up approximately 15 mm. The nasal aperture was dissected free so that the soft tissue was separated from the floor of the nose. This was done bilaterally. The anterior nasal spine was removed with a side cutting rongeur. It was noted from the start of the first sensation the patient was bleeding heavily. This was kept under control using pressure and local anesthesia. With a retractor at the pterygoid plate a reciprocating saw made a cut from the pterygoid plate forward. A right angle incision was then made using a cross cut fissure bur and brought forward to the nasal aperture. The soft tissue was protected with a beaded periosteal. Bur holes were then made in the anterior lateral sinus wall 10 mm apart for reference. The soft tissue incision was then carried to the first molar on the patient's right side. A dissection was carried to the pterygoid plate area. On the patient's right side. With the protected retractor in place reciprocating saw was used to make a cut extending from the pterygoid plate forward. The same right angle incision was made with a 701 cross cut fissure bur and brought to the nasal aperture. Again the nasal aperture was protected with a periosteal elevator. Again bleeding was attempted to be under control  using pressure and local anesthesia. A nasal osteotome was then tapped into position. This was followed by 2 sinus wall osteotomes. Once all of these were tapped into position and removed the pterygoid plate osteotome was tapped on each side using the finger intraorally to feel the leading edge. At this point the maxilla was down fractured slowly taking care to not tear soft tissue. The floor of the nose was dissected free from the floor of the nose. One small tear of the floor of the nose was then repaired with multiple 4-0 Vicryl sutures. The bone was trimmed with a significant cutting rongeur and regular rongeurs and there were no sharp edges. Once all areas were smoothed the maxilla was mobilized further using a Hanahan retractor so that no sharp edges were cutting tissue. The color of the gingiva remained pink throughout this whole procedure. Once the maxilla could be pushed into the proper occlusion it was connected together using IMF wires to the mandible. The maxilla was then passively rotate into position and plated using 4 maxillary Stryker plates. Each plate was bent to fit passively and hold the maxilla and a firm position. 2 bone grafts were then cut and fitted to fit as stops in the right angle incisions. A screw was placed in each one, each one in position. Again it was noted that there was significant bleeding from all areas. The area was washed out and sutured close. At this point no bleeding was noted. The soft tissue was closed in a V-Y fashion using multiple 4-0 Vicryl sutures. Because bleeding began to become extensive again as her pressure came  back up they ran a hemoglobin and hematocrit which showed it to be 9.7 and 26. It was felt that she should be extubated in the main or after letting her settled down overnight. She was transferred to the main hospital

## 2016-10-15 NOTE — Progress Notes (Signed)
Pt arrived via [facility], nasal ETT in right nare, pt spontaneously breathing with O2 supplied by ambu bag attached to ETT,  EMT not needing to bag. ETCO2 was being monitored. EMT stated the cuff of the ETT is leaking or has ruptured and therefore the pt was not placed on a vent for transport.  Pt's vitals are stable at this time.

## 2016-10-15 NOTE — Transfer of Care (Signed)
Immediate Anesthesia Transfer of Care Note  Patient: Anna [name]  Procedure(s) Performed: Procedure(s): LEFORTE 1 MAXILLARY OSTEOTOMY WITH ADVANCEMENT AND GRAFT (N/A)  Patient Location: PACU and [facility]  Anesthesia Type:General  Level of Consciousness: awake, alert , responds to stimulation and Patient remains intubated per anesthesia plan  Airway & Oxygen Therapy: Patient Spontanous Breathing, Patient connected to T-piece oxygen and Patient remains intubated per anesthesia plan  Post-op Assessment: Report given to RN and Post -op Vital signs reviewed and stable  Post vital signs: Reviewed and stable  Last Vitals:  Vitals:   10/15/16 0647 10/15/16 1230  BP: (!) 143/88 115/72  Pulse: 91 90  Resp: 20 19  Temp: 36.9 C (!) 36.3 C    Last Pain:  Vitals:   10/15/16 0647  TempSrc: Oral         Complications: No apparent anesthesia complications

## 2016-10-15 NOTE — Addendum Note (Signed)
Addendum  created 10/15/16 1415 by Carney Living, CRNA   Anesthesia Intra LDAs edited, LDA properties accepted

## 2016-10-15 NOTE — Progress Notes (Signed)
Pt placed on full vent support due to being sedated to exchange ETT.

## 2016-10-16 ENCOUNTER — Inpatient Hospital Stay (HOSPITAL_COMMUNITY): Payer: BLUE CROSS/BLUE SHIELD

## 2016-10-16 LAB — POCT I-STAT 7, (LYTES, BLD GAS, ICA,H+H)
Acid-base deficit: 1 mmol/L (ref 0.0–2.0)
BICARBONATE: 24.2 mmol/L (ref 20.0–28.0)
Calcium, Ion: 1.22 mmol/L (ref 1.15–1.40)
HEMATOCRIT: 26 % — AB (ref 36.0–46.0)
Hemoglobin: 8.8 g/dL — ABNORMAL LOW (ref 12.0–15.0)
O2 SAT: 98 %
PCO2 ART: 40.4 mmHg (ref 32.0–48.0)
PO2 ART: 99 mmHg (ref 83.0–108.0)
Potassium: 3.8 mmol/L (ref 3.5–5.1)
Sodium: 142 mmol/L (ref 135–145)
TCO2: 25 mmol/L (ref 0–100)
pH, Arterial: 7.385 (ref 7.350–7.450)

## 2016-10-16 LAB — BLOOD GAS, ARTERIAL
ACID-BASE DEFICIT: 0.9 mmol/L (ref 0.0–2.0)
Bicarbonate: 23.6 mmol/L (ref 20.0–28.0)
Drawn by: 51133
FIO2: 40
LHR: 18 {breaths}/min
MECHVT: 460 mL
O2 Saturation: 97.5 %
PATIENT TEMPERATURE: 98.6
PEEP/CPAP: 5 cmH2O
PO2 ART: 93.5 mmHg (ref 83.0–108.0)
pCO2 arterial: 41.3 mmHg (ref 32.0–48.0)
pH, Arterial: 7.376 (ref 7.350–7.450)

## 2016-10-16 LAB — GLUCOSE, CAPILLARY
GLUCOSE-CAPILLARY: 175 mg/dL — AB (ref 65–99)
GLUCOSE-CAPILLARY: 134 mg/dL — AB (ref 65–99)
GLUCOSE-CAPILLARY: 125 mg/dL — AB (ref 65–99)
Glucose-Capillary: 137 mg/dL — ABNORMAL HIGH (ref 65–99)
Glucose-Capillary: 155 mg/dL — ABNORMAL HIGH (ref 65–99)
Glucose-Capillary: 165 mg/dL — ABNORMAL HIGH (ref 65–99)
Glucose-Capillary: 168 mg/dL — ABNORMAL HIGH (ref 65–99)

## 2016-10-16 LAB — CBC
HEMATOCRIT: 23.8 % — AB (ref 36.0–46.0)
HEMATOCRIT: 22.6 % — AB (ref 36.0–46.0)
HEMOGLOBIN: 7.9 g/dL — AB (ref 12.0–15.0)
Hemoglobin: 7.5 g/dL — ABNORMAL LOW (ref 12.0–15.0)
MCH: 30.5 pg (ref 26.0–34.0)
MCH: 31.3 pg (ref 26.0–34.0)
MCHC: 33.2 g/dL (ref 30.0–36.0)
MCHC: 33.2 g/dL (ref 30.0–36.0)
MCV: 91.9 fL (ref 78.0–100.0)
MCV: 94.2 fL (ref 78.0–100.0)
Platelets: 137 10*3/uL — ABNORMAL LOW (ref 150–400)
Platelets: 122 10*3/uL — ABNORMAL LOW (ref 150–400)
RBC: 2.59 MIL/uL — AB (ref 3.87–5.11)
RBC: 2.4 MIL/uL — ABNORMAL LOW (ref 3.87–5.11)
RDW: 13.1 % (ref 11.5–15.5)
RDW: 13.4 % (ref 11.5–15.5)
WBC: 6.9 10*3/uL (ref 4.0–10.5)
WBC: 9.2 10*3/uL (ref 4.0–10.5)

## 2016-10-16 LAB — BASIC METABOLIC PANEL
Anion gap: 7 (ref 5–15)
BUN: 24 mg/dL — ABNORMAL HIGH (ref 6–20)
CO2: 25 mmol/L (ref 22–32)
Calcium: 8 mg/dL — ABNORMAL LOW (ref 8.9–10.3)
Chloride: 108 mmol/L (ref 101–111)
Creatinine, Ser: 1.08 mg/dL — ABNORMAL HIGH (ref 0.44–1.00)
GFR calc Af Amer: >60 mL/min (ref >60)
GFR calc non Af Amer: 55 mL/min — ABNORMAL LOW (ref >60)
Glucose, Bld: 167 mg/dL — ABNORMAL HIGH (ref 65–99)
Potassium: 4 mmol/L (ref 3.5–5.1)
Sodium: 140 mmol/L (ref 135–145)

## 2016-10-16 LAB — PHOSPHORUS: PHOSPHORUS: 3.5 mg/dL (ref 2.5–4.6)

## 2016-10-16 LAB — MAGNESIUM: MAGNESIUM: 1.8 mg/dL (ref 1.7–2.4)

## 2016-10-16 MED ORDER — PIPERACILLIN-TAZOBACTAM 3.375 G IVPB
3.3750 g | Freq: Three times a day (TID) | INTRAVENOUS | Status: DC
  Administered 2016-10-16 – 2016-10-17 (×3): 3.375 g via INTRAVENOUS
  Filled 2016-10-16 (×3): qty 50

## 2016-10-16 MED ORDER — VANCOMYCIN HCL IN DEXTROSE 1-5 GM/200ML-% IV SOLN
1000.0000 mg | Freq: Two times a day (BID) | INTRAVENOUS | Status: DC
  Administered 2016-10-16 – 2016-10-17 (×2): 1000 mg via INTRAVENOUS
  Filled 2016-10-16 (×2): qty 200

## 2016-10-16 MED ORDER — LACTATED RINGERS IV SOLN
INTRAVENOUS | Status: DC
  Administered 2016-10-16 – 2016-10-19 (×3): via INTRAVENOUS

## 2016-10-16 MED ORDER — LABETALOL HCL 5 MG/ML IV SOLN
10.0000 mg | INTRAVENOUS | Status: DC | PRN
  Administered 2016-10-17 (×2): 10 mg via INTRAVENOUS
  Filled 2016-10-16 (×2): qty 4

## 2016-10-16 MED ORDER — HYDROCORTISONE 0.5 % EX CREA
TOPICAL_CREAM | Freq: Two times a day (BID) | CUTANEOUS | Status: DC
  Administered 2016-10-16: 23:00:00 via TOPICAL
  Administered 2016-10-16: 1 via TOPICAL
  Administered 2016-10-17 (×2): via TOPICAL
  Administered 2016-10-18: 1 via TOPICAL
  Administered 2016-10-18 – 2016-10-19 (×2): via TOPICAL
  Administered 2016-10-19: 1 via TOPICAL
  Administered 2016-10-21: 11:00:00 via TOPICAL
  Filled 2016-10-16 (×3): qty 28.35

## 2016-10-16 MED ORDER — PHENYLEPHRINE HCL 0.5 % NA SOLN
1.0000 [drp] | Freq: Four times a day (QID) | NASAL | Status: DC | PRN
  Administered 2016-10-16 – 2016-10-18 (×4): 1 [drp] via NASAL
  Filled 2016-10-16: qty 15

## 2016-10-16 NOTE — Progress Notes (Signed)
Note last night: No significant bleeding. Patient responds to conversation and commands.  Rash noted on abdomen and left anticubital fossa.  Not sure of origin.  Antibiotics switched in ICU.  Patient was reintubated orally.   Will follow

## 2016-10-16 NOTE — Progress Notes (Signed)
The patient is intubated resting comfortably.  VS. Awake and responding to commands.  No active bleeding noted. Swelling is soft and continues to decrease. Skin color is good. Packing on left side of nose removed.  Area sprayed with neosynephrine.  No bleeding noted.  Will continue to follow.

## 2016-10-16 NOTE — Care Management Note (Signed)
Case Management Note  Patient Details  Name: Anna [name] MRN: [id number] Date of Birth: 08-Nov-1955  Subjective/Objective:  From home alone,   Was seen by dentist for difficulty with chewing, found to have malocclusion with functional deformity and was recommended that she have a Lefort 1 maxillary advancement with bone graft performed.  She came to day surgery 7/25 for planned surgery, required nasotracheal intubation and post procedure, had moderate bleeding from intubation site with edema. Due to airway concern she was transferred to [facility] for further evaluation .  Conts on vent, precedex, fentanyl.                  Action/Plan: NCM will follow for dc needs.  Expected Discharge Date:                  Expected Discharge Plan:     In-House Referral:     Discharge planning Services  CM Consult  Post Acute Care Choice:    Choice offered to:     DME Arranged:    DME Agency:     HH Arranged:    HH Agency:     Status of Service:  In process, will continue to follow  If discussed at Long Length of Stay Meetings, dates discussed:    Additional Comments:  Zenon Mayo, RN 10/16/2016, 12:41 PM

## 2016-10-16 NOTE — Progress Notes (Signed)
PCCM Progress Note  Admission date: 10/15/2016 Referring provider: Dr. Sabra Heck  CC: Difficulty chewing  HPI: 61 yo female with difficulty chewing.  She had malocclusion and had Lefort 1 maxillary advancement.  Difficulty airway and remained on vent post op.  Subjective: Didn't tolerate pressure support.  Vital signs: BP (!) 164/86   Pulse 61   Temp 98.3 F (36.8 C) (Axillary)   Resp 18   Ht 5\' 5"  (1.651 m)   Wt 182 lb 9.6 oz (82.8 kg)   SpO2 98%   BMI 30.39 kg/m   Intake/output: I/O last 3 completed shifts: In: 3978.3 [I.V.:2418.3; Other:510; IV Piggyback:1050] Out: 1920 [Urine:670; Blood:1250]  General - alert Eyes - pupils reactive ENT - nasal packing, facial edema Cardiac - regular, no murmur Chest - no wheeze, rales Abd - soft, non tender Ext - no edema Skin - rash on Lt arm and under abdominal folds Neuro - normal strength   CMP Latest Ref Rng & Units 10/16/2016 10/15/2016 10/10/2015  Glucose 65 - 99 mg/dL 167(H) 204(H) 110(H)  BUN 6 - 20 mg/dL 24(H) 21(H) 10  Creatinine 0.44 - 1.00 mg/dL 1.08(H) 0.80 1.00  Sodium 135 - 145 mmol/L 140 140 138  Potassium 3.5 - 5.1 mmol/L 4.0 3.9 3.3(L)  Chloride 101 - 111 mmol/L 108 106 104  CO2 22 - 32 mmol/L 25 - 27  Calcium 8.9 - 10.3 mg/dL 8.0(L) - 9.5     CBC Latest Ref Rng & Units 10/16/2016 10/15/2016 10/15/2016  WBC 4.0 - 10.5 K/uL 6.9 18.3(H) -  Hemoglobin 12.0 - 15.0 g/dL 7.9(L) 9.4(L) 9.2(L)  Hematocrit 36.0 - 46.0 % 23.8(L) 28.1(L) 27.0(L)  Platelets 150 - 400 K/uL 137(L) 227 -     ABG    Component Value Date/Time   PHART 7.376 10/16/2016 0330   PCO2ART 41.3 10/16/2016 0330   PO2ART 93.5 10/16/2016 0330   HCO3 23.6 10/16/2016 0330   TCO2 26 10/15/2016 1119   ACIDBASEDEF 0.9 10/16/2016 0330   O2SAT 97.5 10/16/2016 0330     CBG (last 3)   Recent Labs  10/16/16 0309 10/16/16 0759 10/16/16 1141  GLUCAP 168* 155* 134*     Imaging: Dg Abd 1 View  Result Date: 10/15/2016 CLINICAL DATA:  OG tube  placement. EXAM: ABDOMEN - 1 VIEW COMPARISON:  CT scan dated 10/10/2015 FINDINGS: OG tube tip is in the distal stomach near the pylorus and duodenal bulb. Bowel gas pattern is normal. IMPRESSION: OG tube tip in the distal stomach.  Benign-appearing abdomen. Electronically Signed   By: Lorriane Shire M.D.   On: 10/15/2016 15:06   Dg Chest Port 1 View  Result Date: 10/16/2016 CLINICAL DATA:  Respiratory failure.  Shortness of breath . EXAM: PORTABLE CHEST 1 VIEW COMPARISON:  10/15/2016. FINDINGS: Endotracheal tube and NG tube in stable position. Heart size stable. Improved aeration of lung bases with persistent mild bibasilar atelectasis. Small left pleural effusion cannot be excluded. No pneumothorax . IMPRESSION: 1. Lines and tubes in stable position. 2. Improved aeration of lung bases with persistent mild bibasilar atelectasis. Small left pleural effusion cannot be excluded. Electronically Signed   By: Marcello Moores  Register   On: 10/16/2016 06:40   Portable Chest Xray  Result Date: 10/15/2016 CLINICAL DATA:  61 year old female status post intubation EXAM: PORTABLE CHEST 1 VIEW COMPARISON:  Prior chest x-ray 10/26/2007 FINDINGS: Patient is intubated. The tip the endotracheal tube is 3.3 cm above the carina. A gastric tube is present. The tip lies off the field of view,  below the diaphragm and presumably within the stomach. Low inspiratory volumes with bibasilar opacities favored to reflect atelectasis. Background interstitial prominence and bronchitic changes with some with her prior. No definite pulmonary edema, pleural effusion or pneumothorax. No acute osseous abnormality. IMPRESSION: 1. The endotracheal tube tip is 3.3 cm above the carina. 2. The tip of the gastric tube lies off the field of view, below the diaphragm and presumably within the stomach. 3. Low inspiratory volumes with bibasilar and perihilar atelectasis. 4. Chronic bronchitic changes and interstitial prominence. Electronically Signed   By:  Jacqulynn Cadet M.D.   On: 10/15/2016 15:03    Lines/tubes: ETT 7/25 >>  A line 7/25 >>  Events: 7/25 Admit  Assessment/plan:  Acute respiratory failure with compromise airway. - full vent support  S/p maxillary advancement. - post op care per oral surgery  Anemia after surgery. - f/u CBC  DM. - SSI  Skin rash. - hydrocortisone cream  CC time 33 minutes  Chesley Mires, MD [place] 10/16/2016, 3:05 PM Pager:  [phone] After 3pm call: [phone]

## 2016-10-16 NOTE — Progress Notes (Signed)
This morning patient awake, sedated.  No active bleeding. Hg dropped from 9.9 to 7.9.  No transfusion  Yet.  Swelling decreasing around face.  From an Oral Surgery point of view surgical areas are doing well.  If stable the tube can be pulled when appropriate. Will continue to follow.

## 2016-10-17 ENCOUNTER — Inpatient Hospital Stay (HOSPITAL_COMMUNITY): Payer: BLUE CROSS/BLUE SHIELD

## 2016-10-17 DIAGNOSIS — J988 Other specified respiratory disorders: Secondary | ICD-10-CM

## 2016-10-17 LAB — GLUCOSE, CAPILLARY
GLUCOSE-CAPILLARY: 145 mg/dL — AB (ref 65–99)
GLUCOSE-CAPILLARY: 90 mg/dL (ref 65–99)
GLUCOSE-CAPILLARY: 95 mg/dL (ref 65–99)
Glucose-Capillary: 170 mg/dL — ABNORMAL HIGH (ref 65–99)
Glucose-Capillary: 111 mg/dL — ABNORMAL HIGH (ref 65–99)
Glucose-Capillary: 100 mg/dL — ABNORMAL HIGH (ref 65–99)

## 2016-10-17 LAB — COMPREHENSIVE METABOLIC PANEL
ALT: 14 U/L (ref 14–54)
AST: 18 U/L (ref 15–41)
Albumin: 2.9 g/dL — ABNORMAL LOW (ref 3.5–5.0)
Alkaline Phosphatase: 23 U/L — ABNORMAL LOW (ref 38–126)
Anion gap: 7 (ref 5–15)
BILIRUBIN TOTAL: 0.4 mg/dL (ref 0.3–1.2)
BUN: 26 mg/dL — AB (ref 6–20)
CO2: 25 mmol/L (ref 22–32)
CREATININE: 0.99 mg/dL (ref 0.44–1.00)
Calcium: 8.4 mg/dL — ABNORMAL LOW (ref 8.9–10.3)
Chloride: 107 mmol/L (ref 101–111)
GFR calc Af Amer: >60 mL/min (ref >60)
Glucose, Bld: 140 mg/dL — ABNORMAL HIGH (ref 65–99)
POTASSIUM: 4.3 mmol/L (ref 3.5–5.1)
Sodium: 139 mmol/L (ref 135–145)
TOTAL PROTEIN: 4.9 g/dL — AB (ref 6.5–8.1)

## 2016-10-17 LAB — CBC
HCT: 22.5 % — ABNORMAL LOW (ref 36.0–46.0)
Hemoglobin: 7.5 g/dL — ABNORMAL LOW (ref 12.0–15.0)
MCH: 31.1 pg (ref 26.0–34.0)
MCHC: 33.3 g/dL (ref 30.0–36.0)
MCV: 93.4 fL (ref 78.0–100.0)
PLATELETS: 139 10*3/uL — AB (ref 150–400)
RBC: 2.41 MIL/uL — ABNORMAL LOW (ref 3.87–5.11)
RDW: 13.5 % (ref 11.5–15.5)
WBC: 7.6 10*3/uL (ref 4.0–10.5)

## 2016-10-17 MED ORDER — FENTANYL CITRATE (PF) 100 MCG/2ML IJ SOLN
50.0000 ug | INTRAMUSCULAR | Status: DC | PRN
  Administered 2016-10-17 – 2016-10-21 (×2): 100 ug via INTRAVENOUS
  Filled 2016-10-17 (×2): qty 2

## 2016-10-17 MED ORDER — DEXMEDETOMIDINE HCL 200 MCG/2ML IV SOLN
0.4000 ug/kg/h | INTRAVENOUS | Status: DC
  Administered 2016-10-17: 0.4 ug/kg/h via INTRAVENOUS
  Administered 2016-10-18 (×2): 0.7 ug/kg/h via INTRAVENOUS
  Filled 2016-10-17 (×3): qty 2

## 2016-10-17 MED ORDER — LORAZEPAM 2 MG/ML IJ SOLN
1.0000 mg | INTRAMUSCULAR | Status: DC | PRN
  Administered 2016-10-17 – 2016-10-18 (×4): 1 mg via INTRAVENOUS
  Filled 2016-10-17 (×4): qty 1

## 2016-10-17 MED ORDER — PROMETHAZINE HCL 25 MG/ML IJ SOLN
12.5000 mg | Freq: Once | INTRAMUSCULAR | Status: AC
  Administered 2016-10-17: 12.5 mg via INTRAVENOUS
  Filled 2016-10-17: qty 1

## 2016-10-17 MED ORDER — ONDANSETRON HCL 4 MG/2ML IJ SOLN
4.0000 mg | Freq: Four times a day (QID) | INTRAMUSCULAR | Status: DC | PRN
Start: 2016-10-17 — End: 2016-10-17
  Administered 2016-10-17: 4 mg via INTRAVENOUS
  Filled 2016-10-17: qty 2

## 2016-10-17 MED ORDER — PROCHLORPERAZINE EDISYLATE 5 MG/ML IJ SOLN
10.0000 mg | Freq: Four times a day (QID) | INTRAMUSCULAR | Status: DC | PRN
  Administered 2016-10-17 – 2016-10-18 (×2): 10 mg via INTRAVENOUS
  Filled 2016-10-17 (×5): qty 2

## 2016-10-17 MED ORDER — ONDANSETRON HCL 4 MG/2ML IJ SOLN
4.0000 mg | Freq: Four times a day (QID) | INTRAMUSCULAR | Status: DC | PRN
  Administered 2016-10-17 – 2016-10-18 (×2): 8 mg via INTRAVENOUS
  Administered 2016-10-20: 4 mg via INTRAVENOUS
  Filled 2016-10-17 (×2): qty 4
  Filled 2016-10-17: qty 2

## 2016-10-17 MED ORDER — ACETAMINOPHEN 650 MG RE SUPP
650.0000 mg | Freq: Four times a day (QID) | RECTAL | Status: DC | PRN
  Filled 2016-10-17: qty 1

## 2016-10-17 NOTE — Progress Notes (Signed)
[place] Progress Note Patient Name: Anna [name] DOB: [birth year] MRN: [id number]   Date of Service  10/17/2016  HPI/Events of Note  Nurse notified of patient with nausea and oozing from removal of nasal packaging. Patient with some emesis. No relief with IV Zofran. Some anxiety as well.   eICU Interventions  1. Phenergan 12.5 mg IV 1 2. Critical care extender to assess patient at bedside      Intervention Category Major Interventions: Airway management  Tera Partridge 10/17/2016, 4:45 PM

## 2016-10-17 NOTE — Progress Notes (Signed)
Pt placed on wean for a short time by MD and then Rt extubated. No complications at this time.

## 2016-10-17 NOTE — Progress Notes (Signed)
Repacked nose with pledgets soaked in neosynephrine,  No active bleeding after.  Still having nausea.  Recommend continue phenergan or consider compazine q4h.

## 2016-10-17 NOTE — Progress Notes (Signed)
[place] Progress Note Patient Name: TKAI LARGE DOB: [birth year] MRN: [id number]   Date of Service  10/17/2016  HPI/Events of Note  Persistent nausea despite Zofran & Phenergan. Ativan for anxiety. Patient reportedly slightly altered.  eICU Interventions  Compazine IV prn nausea     Intervention Category Major Interventions: Other:  Tera Partridge 10/17/2016, 7:23 PM

## 2016-10-17 NOTE — Procedures (Signed)
Extubation Procedure Note  Patient Details:   Name: Anna [name] DOB: [birth year] MRN: [id number]   Airway Documentation:     Evaluation  O2 sats: stable throughout Complications: No apparent complications Patient did tolerate procedure well. Bilateral Breath Sounds: Clear, Diminished   Yes  [name] 10/17/2016, 10:55 AM

## 2016-10-17 NOTE — Progress Notes (Signed)
[place] Progress Note Patient Name: Anna [name] DOB: [birth year] MRN: [id number]   Date of Service  10/17/2016  HPI/Events of Note  Patient still with intermittent anxiety. Nausea improved with Compazine. Has IV Ativan ordered but transient effectiveness. Extubated today. Also developing fever.   eICU Interventions  1. Tylenol suppository when necessary fever 2. Precedex drip to maintain patient's calm if needed      Intervention Category Major Interventions: Delirium, psychosis, severe agitation - evaluation and management  Tera Partridge 10/17/2016, 8:48 PM

## 2016-10-17 NOTE — Progress Notes (Signed)
The patient appears to be resting comfortably.  No bleeding was noted from left nostril where the packing was removed.  Dark blood (old) in drainage tube. Some of this may be from sinus where surgery took place.  Now being evacuated.  Facial swelling continues to improve. Area is soft.  Waiting for extubation.  If concerned with swelling in airway consider CT of neck or deflate cuff and let her breath around tube.   Will remove packing on right side this afternoon.

## 2016-10-17 NOTE — Progress Notes (Signed)
Wasted 60 mL's Fentanyl with [place], RN

## 2016-10-17 NOTE — Progress Notes (Signed)
PULMONARY / CRITICAL CARE MEDICINE   Name: Anna [name] MRN: [id number] DOB: January 22, 1956    ADMISSION DATE:  10/15/2016 CONSULTATION DATE:  10/15/16  REFERRING MD:  Dr. Sabra Heck  CHIEF COMPLAINT:  Difficulty chewing  HISTORY OF PRESENT ILLNESS:   61 yo female with difficulty chewing.  She had malocclusion and had Lefort 1 maxillary advancement.  Difficulty airway and remained on vent post op.   SUBJECTIVE:  No acute overnight events.   VITAL SIGNS: BP (!) 113/55   Pulse (!) 51   Temp 97.9 F (36.6 C) (Oral)   Resp (!) 21   Ht 5\' 5"  (1.651 m)   Wt 182 lb 9.6 oz (82.8 kg)   SpO2 100%   BMI 30.39 kg/m   HEMODYNAMICS:    VENTILATOR SETTINGS: Vent Mode: PRVC FiO2 (%):  [40 %] 40 % Set Rate:  [18 bmp] 18 bmp Vt Set:  [460 mL] 460 mL PEEP:  [5 cmH20] 5 cmH20 Plateau Pressure:  [15 cmH20] 15 cmH20  INTAKE / OUTPUT: I/O last 3 completed shifts: In: 2095.1 [I.V.:1735.1; Other:110; IV Piggyback:250] Out: 2015 [Urine:1665; Emesis/NG output:350]  PHYSICAL EXAMINATION: General - alert Eyes - pupils reactive ENT - nasal packing, facial edema Cardiac - regular, no murmur Chest - no wheeze, rales Abd - soft, non tender Ext - no edema Skin - rash on Lt arm and under abdominal folds Neuro - normal strength  LABS:  BMET  Recent Labs Lab 10/15/16 1119 10/15/16 1308 10/16/16 0515 10/17/16 0617  NA 140 142 140 139  K 3.9 3.8 4.0 4.3  CL 106  --  108 107  CO2  --   --  25 25  BUN 21*  --  24* 26*  CREATININE 0.80  --  1.08* 0.99  GLUCOSE 204*  --  167* 140*    Electrolytes  Recent Labs Lab 10/16/16 0515 10/17/16 0617  CALCIUM 8.0* 8.4*  MG 1.8  --   PHOS 3.5  --     CBC  Recent Labs Lab 10/16/16 0515 10/16/16 1915 10/17/16 0617  WBC 6.9 9.2 7.6  HGB 7.9* 7.5* 7.5*  HCT 23.8* 22.6* 22.5*  PLT 137* 122* 139*    Coag's  Recent Labs Lab 10/15/16 1331  APTT 26  INR 1.22    Sepsis Markers No results for input(s): LATICACIDVEN,  PROCALCITON, O2SATVEN in the last 168 hours.  ABG  Recent Labs Lab 10/15/16 1308 10/15/16 1642 10/16/16 0330  PHART 7.385 7.408 7.376  PCO2ART 40.4 34.6 41.3  PO2ART 99.0 313* 93.5    Liver Enzymes  Recent Labs Lab 10/17/16 0617  AST 18  ALT 14  ALKPHOS 23*  BILITOT 0.4  ALBUMIN 2.9*    Cardiac Enzymes No results for input(s): TROPONINI, PROBNP in the last 168 hours.  Glucose  Recent Labs Lab 10/16/16 1141 10/16/16 1523 10/16/16 1933 10/16/16 2342 10/17/16 0335 10/17/16 0728  GLUCAP 134* 125* 165* 137* 145* 170*    Imaging Dg Chest Port 1 View  Result Date: 10/17/2016 CLINICAL DATA:  Respiratory failure. EXAM: PORTABLE CHEST 1 VIEW COMPARISON:  10/16/2016. FINDINGS: Endotracheal tube, NG tube in stable position. Heart size stable. Persistent bibasilar atelectasis. Mild infiltrate in the left lung base on today's exam No pleural effusion or pneumothorax. IMPRESSION: 1. Lines and tubes in stable position. 2. Persistent bibasilar atelectasis. Mild left base infiltrate noted on today's exam. Electronically Signed   By: [place]   On: 10/17/2016 07:04    CULTURES: none  ANTIBIOTICS: vanc/zosyn  SIGNIFICANT EVENTS: 7/25> admission  LINES/TUBES: ETT 7/25 >>  A line 7/25 >>   ASSESSMENT / PLAN:  PULMONARY Acute resp failure with compromised airway P:  Extubate today Maintain sats >92%  CARDIOVASCULAR A:  No active problems P:  telemetry  RENAL A:   ?AKI, appears to be about baseline P:   Continue monitoring UOP and daily BMP  GASTROINTESTINAL A:   No active problems P:   GI ppx with protonix  HEMATOLOGIC A:   Anemia s/p surgery P:  Cont to monitor Transfuse hgb <7  INFECTIOUS A:   Empiric coverage for oral surgery + nasal packing P:   Discontinue abx Trend WBC  ENDOCRINE A:   Hyperglycemia, likely 2/2 steroids P:   Continue mSSI q4hr CBGs  MAXILLOFACIAL A:  S/p maxillofacial advancement P: Post/op care  per oral surgery  NEUROLOGIC A:   Sedated, mechanically ventillated P:   RASS goal: 0/-1 Precedex/fentanyl; wean precedex Extubate today Daily WUA    Pulmonary and [place] Pager: [phone]  10/17/2016, 8:09 AM

## 2016-10-17 NOTE — Progress Notes (Signed)
Pt complaining of swelling in throat and mouth area. Rt went to set up humidified oxygen and pt refused. No distress noted at this time. RN aware.

## 2016-10-17 NOTE — Progress Notes (Signed)
Called to patient bedside for continued bleeding from nasal cavity. Dr. Sabra Heck contacted who advised to given phenylephrine nasal drops and if no improvement to call him back at [phone] and he will come to re-pack.   Anna [name], AGACNP-BC [facility]  Pgr: [phone]  PCCM Pgr: [phone]

## 2016-10-17 NOTE — Progress Notes (Signed)
Patient has been extubated and is doing well. Swelling is coming down nicely.  Packing removed from right nostril with no ill effects.  No bleeding from nose. Occlusion stable.  Maxilla is in proper position.   Will continue to follow.

## 2016-10-17 NOTE — Progress Notes (Signed)
Spoke with Jannette Fogo, DDS.  Patient continuing to experience nausea and vomiting, despite multiple medications for both nausea and anxiety.  Expressed to Dr. Sabra Heck my concern of this starting around the time he removed her nasal packing.  Dr. Sabra Heck will be coming by to repack nasal cavity, to try to reduce that amount of bleeding and blood pooling in the back of the patients throat.

## 2016-10-18 LAB — BASIC METABOLIC PANEL
Anion gap: 6 (ref 5–15)
BUN: 24 mg/dL — AB (ref 6–20)
CALCIUM: 8.2 mg/dL — AB (ref 8.9–10.3)
CHLORIDE: 109 mmol/L (ref 101–111)
CO2: 27 mmol/L (ref 22–32)
CREATININE: 0.96 mg/dL (ref 0.44–1.00)
GFR calc non Af Amer: >60 mL/min (ref >60)
Glucose, Bld: 113 mg/dL — ABNORMAL HIGH (ref 65–99)
Potassium: 4.1 mmol/L (ref 3.5–5.1)
Sodium: 142 mmol/L (ref 135–145)

## 2016-10-18 LAB — GLUCOSE, CAPILLARY
GLUCOSE-CAPILLARY: 115 mg/dL — AB (ref 65–99)
GLUCOSE-CAPILLARY: 112 mg/dL — AB (ref 65–99)
Glucose-Capillary: 119 mg/dL — ABNORMAL HIGH (ref 65–99)
Glucose-Capillary: 117 mg/dL — ABNORMAL HIGH (ref 65–99)
Glucose-Capillary: 96 mg/dL (ref 65–99)

## 2016-10-18 LAB — CBC
HCT: 24.9 % — ABNORMAL LOW (ref 36.0–46.0)
Hemoglobin: 7.9 g/dL — ABNORMAL LOW (ref 12.0–15.0)
MCH: 30.4 pg (ref 26.0–34.0)
MCHC: 31.7 g/dL (ref 30.0–36.0)
MCV: 95.8 fL (ref 78.0–100.0)
PLATELETS: 127 10*3/uL — AB (ref 150–400)
RBC: 2.6 MIL/uL — ABNORMAL LOW (ref 3.87–5.11)
RDW: 13.7 % (ref 11.5–15.5)
WBC: 7.4 10*3/uL (ref 4.0–10.5)

## 2016-10-18 MED ORDER — DEXMEDETOMIDINE HCL IN NACL 400 MCG/100ML IV SOLN
0.4000 ug/kg/h | INTRAVENOUS | Status: DC
  Administered 2016-10-18: 0.7 ug/kg/h via INTRAVENOUS
  Administered 2016-10-18: 0.4 ug/kg/h via INTRAVENOUS
  Administered 2016-10-18: 0.7 ug/kg/h via INTRAVENOUS
  Filled 2016-10-18 (×3): qty 100

## 2016-10-18 NOTE — Evaluation (Signed)
Clinical/Bedside Swallow Evaluation Patient Details  Name: Anna [name] MRN: [id number] Date of Birth: 1955-12-08  Today's Date: 10/18/2016 Time: SLP Start Time (ACUTE ONLY): 1545 SLP Stop Time (ACUTE ONLY): 1600 SLP Time Calculation (min) (ACUTE ONLY): 15 min  Past Medical History:  Past Medical History:  Diagnosis Date  . Anxiety   . Arthritis   . Diverticulitis   . Fibromyalgia   . Gluten intolerance   . Hypertension    Past Surgical History:  Past Surgical History:  Procedure Laterality Date  . ABDOMINAL HYSTERECTOMY     uterus only  . MAXILLARY LE FORTE II OSTEOTOMY N/A 10/15/2016   Procedure: LEFORTE 1 MAXILLARY OSTEOTOMY WITH ADVANCEMENT AND GRAFT;  Surgeon: Jannette Fogo, DDS;  Location: [facility];  Service: Oral Surgery;  Laterality: N/A;   HPI:  61 yo female with difficulty chewing. She had malocclusion and had Lefort 1 maxillary advancement. She presented to day surgery 7/25 for planned surgery. She required nasotracheal intubation and post procedure, had moderate bleeding from intubation site along with significant edema. Possibly 1L blood loss. Due concerns for airway edema, she was transferred to [facility] for further evaluation and management. While in PACU at [facility], she had nasotracheal tube replaced with oral tube due to cuff leak on nasotracheal tube. Extubated 7/27. Pt with intermittent anxiety, on precedex.   Assessment / Plan / Recommendation Clinical Impression  Patient presents with moderate-severe aspiration risk s/p maxillary advancement with difficult intubation (total 3 day intubation), with moderate bleeding and edema, as well as lethargy, current mentation. She rouses today with verbal cues, follows basic commands. Pt able to perform some oral care; SLP assisted with removal of bloody secretions from palate, lingual surface. Tolerates ice chips, thin liquid via straw sips, and teaspoons pureed solids with no overt signs of aspiration, however pt  does have diminished labial seal and anterior loss of thin liquids. She requires cues for arousal, attention intermittently, and holds puree requiring verbal cue to swallow. Given her current mentation, recommend pt be allowed to have ice chips after oral care when alert; may given essential medications crushed in puree. Anticipate advancement as her mental status, lethargy improves. Will follow up next date for improvements at bedside, instrumental assessment if appropriate.  SLP Visit Diagnosis: Dysphagia, oropharyngeal phase (R13.12)    Aspiration Risk  Moderate aspiration risk;Severe aspiration risk    Diet Recommendation Ice chips PRN after oral care   Liquid Administration via: Spoon Medication Administration: Crushed with puree Supervision: Staff to assist with self feeding Compensations: Slow rate;Small sips/bites (check oral cavity to ensure clearance)    Other  Recommendations Oral Care Recommendations: Oral care QID;Oral care prior to ice chip/H20   Follow up Recommendations Other (comment) (TBD)      Frequency and Duration min 2x/week  2 weeks       Prognosis Prognosis for Safe Diet Advancement: Good Barriers to Reach Goals: Cognitive deficits      Swallow Study   General Date of Onset: 10/15/16 HPI: 61 yo female with difficulty chewing. She had malocclusion and had Lefort 1 maxillary advancement. She presented to day surgery 7/25 for planned surgery. She required nasotracheal intubation and post procedure, had moderate bleeding from intubation site along with significant edema. Possibly 1L blood loss. Due concerns for airway edema, she was transferred to [facility] for further evaluation and management. While in PACU at [facility], she had nasotracheal tube replaced with oral tube due to cuff leak on nasotracheal tube. Extubated 7/27. Pt with  intermittent anxiety, on precedex. Type of Study: Bedside Swallow Evaluation Previous Swallow Assessment: none in chart Diet Prior to this  Study: NPO Temperature Spikes Noted: No Respiratory Status: Other (comment) (face mask not on, per RN "for comfort") History of Recent Intubation: Yes Length of Intubations (days): 3 days Date extubated: 10/17/16 Behavior/Cognition: Cooperative;Lethargic/Drowsy Oral Cavity Assessment: Dry;Dried secretions (bloody secretions) Oral Care Completed by SLP: Yes Oral Cavity - Dentition: Adequate natural dentition Vision: Functional for self-feeding Self-Feeding Abilities: Needs assist Patient Positioning: Upright in bed Baseline Vocal Quality: Low vocal intensity Volitional Cough: Cognitively unable to elicit Volitional Swallow: Able to elicit    Oral/Motor/Sensory Function Overall Oral Motor/Sensory Function: Moderate impairment Facial ROM: Reduced right;Reduced left Facial Symmetry: Within Functional Limits Facial Strength: Reduced right;Reduced left Facial Sensation: Within Functional Limits Lingual ROM: Reduced right;Reduced left Lingual Symmetry: Within Functional Limits Lingual Strength: Reduced Lingual Sensation: Within Functional Limits Velum: Within Functional Limits Mandible: Impaired (moderate-severely reduced jaw ROM)   Ice Chips Ice chips: Within functional limits   Thin Liquid Thin Liquid: Impaired Presentation: Cup;Straw Oral Phase Impairments: Poor awareness of bolus;Reduced labial seal Oral Phase Functional Implications: Right anterior spillage;Left anterior spillage    Nectar Thick Nectar Thick Liquid: Not tested   Honey Thick Honey Thick Liquid: Not tested   Puree Puree: Impaired Oral Phase Impairments: Poor awareness of bolus Oral Phase Functional Implications: Oral holding Other Comments: Verbal cues to swallow x1   Solid   GO   Anna Lever, MS, CCC-SLP Speech-Language Pathologist [phone] Solid: Not tested        Aliene Altes 10/18/2016,5:28 PM

## 2016-10-18 NOTE — Progress Notes (Signed)
No active bleeding noted this morning.  VS. Patient appears to be resting comfortably but on Ativan.  Rouses easily and communicates.  Maxilla is stable. Swelling from surgery resolving nicely. Will wait 24hours before considering to pull nasal packing  packing.  Nausea is stable with compazine.

## 2016-10-18 NOTE — Progress Notes (Signed)
PCCM Progress Note  Admission date: 10/15/2016 Referring provider: Dr. Sabra Heck  CC: Difficulty chewing  HPI: 61 yo female with difficulty chewing.  She had malocclusion and had Lefort 1 maxillary advancement.  Difficulty airway and remained on vent post op.  Subjective: Started on precedex overnight.  Vital signs: BP 98/64   Pulse 74   Temp 98.1 F (36.7 C) (Axillary)   Resp 14   Ht 5\' 5"  (1.651 m)   Wt 182 lb 9.6 oz (82.8 kg)   SpO2 98%   BMI 30.39 kg/m   Intake/output: I/O last 3 completed shifts: In: 2629.3 [I.V.:2379.3; IV Piggyback:250] Out: 2845 [Urine:2345; Emesis/NG output:500]   General - calm Eyes - pupils reactive ENT - decreased facial edema, voice quality improved Cardiac - regular, no murmur Chest - no wheeze, rales Abd - soft, non tender Ext - no edema Skin - no rashes Neuro - normal strength Psych - normal mood    CMP Latest Ref Rng & Units 10/18/2016 10/17/2016 10/16/2016  Glucose 65 - 99 mg/dL 113(H) 140(H) 167(H)  BUN 6 - 20 mg/dL 24(H) 26(H) 24(H)  Creatinine 0.44 - 1.00 mg/dL 0.96 0.99 1.08(H)  Sodium 135 - 145 mmol/L 142 139 140  Potassium 3.5 - 5.1 mmol/L 4.1 4.3 4.0  Chloride 101 - 111 mmol/L 109 107 108  CO2 22 - 32 mmol/L 27 25 25   Calcium 8.9 - 10.3 mg/dL 8.2(L) 8.4(L) 8.0(L)  Total Protein 6.5 - 8.1 g/dL - 4.9(L) -  Total Bilirubin 0.3 - 1.2 mg/dL - 0.4 -  Alkaline Phos 38 - 126 U/L - 23(L) -  AST 15 - 41 U/L - 18 -  ALT 14 - 54 U/L - 14 -     CBC Latest Ref Rng & Units 10/18/2016 10/17/2016 10/16/2016  WBC 4.0 - 10.5 K/uL 7.4 7.6 9.2  Hemoglobin 12.0 - 15.0 g/dL 7.9(L) 7.5(L) 7.5(L)  Hematocrit 36.0 - 46.0 % 24.9(L) 22.5(L) 22.6(L)  Platelets 150 - 400 K/uL 127(L) 139(L) 122(L)     ABG    Component Value Date/Time   PHART 7.376 10/16/2016 0330   PCO2ART 41.3 10/16/2016 0330   PO2ART 93.5 10/16/2016 0330   HCO3 23.6 10/16/2016 0330   TCO2 25 10/15/2016 1308   ACIDBASEDEF 0.9 10/16/2016 0330   O2SAT 97.5 10/16/2016 0330      CBG (last 3)   Recent Labs  10/17/16 2315 10/18/16 0327 10/18/16 0746  GLUCAP 100* 119* 117*     Imaging: Dg Chest Port 1 View  Result Date: 10/17/2016 CLINICAL DATA:  Respiratory failure. EXAM: PORTABLE CHEST 1 VIEW COMPARISON:  10/16/2016. FINDINGS: Endotracheal tube, NG tube in stable position. Heart size stable. Persistent bibasilar atelectasis. Mild infiltrate in the left lung base on today's exam No pleural effusion or pneumothorax. IMPRESSION: 1. Lines and tubes in stable position. 2. Persistent bibasilar atelectasis. Mild left base infiltrate noted on today's exam. Electronically Signed   By: Marcello Moores  Register   On: 10/17/2016 07:04    Lines/tubes: ETT 7/25 >> 7/27 A line 7/25 >> 7/27  Events: 7/25 Admit  Assessment/plan:  Acute respiratory failure with compromise airway. - resolved  S/p maxillary advancement. - post op care per oral surgery - will get speech therapy to assess swallowing and then advance diet  Anemia after surgery. - f/u CBC  Steroid induced hyperglycemia. - resolved - d/c SSI  Skin rash. - hydrocortisone cream  Acute metabolic encephalopathy. Hx of anxiety, fibromyalgia. - continue precedex  DVT prophylaxis - SCDs SUP - not indicated  Nutrition - NPO Goals of care - Full code  Updated pt's son at bedside   Chesley Mires, MD [place] 10/18/2016, 9:39 AM Pager:  [phone] After 3pm call: [phone]

## 2016-10-19 LAB — BPAM RBC
BLOOD PRODUCT EXPIRATION DATE: 201808132359
Blood Product Expiration Date: 201808132359
ISSUE DATE / TIME: 201807191811
ISSUE DATE / TIME: 201807191811
UNIT TYPE AND RH: 600
Unit Type and Rh: 600

## 2016-10-19 LAB — GLUCOSE, CAPILLARY
GLUCOSE-CAPILLARY: 95 mg/dL (ref 65–99)
GLUCOSE-CAPILLARY: 101 mg/dL — AB (ref 65–99)
GLUCOSE-CAPILLARY: 111 mg/dL — AB (ref 65–99)
Glucose-Capillary: 106 mg/dL — ABNORMAL HIGH (ref 65–99)
Glucose-Capillary: 87 mg/dL (ref 65–99)
Glucose-Capillary: 97 mg/dL (ref 65–99)

## 2016-10-19 LAB — CBC
HCT: 25.5 % — ABNORMAL LOW (ref 36.0–46.0)
Hemoglobin: 8.6 g/dL — ABNORMAL LOW (ref 12.0–15.0)
MCH: 30.9 pg (ref 26.0–34.0)
MCHC: 33.7 g/dL (ref 30.0–36.0)
MCV: 91.7 fL (ref 78.0–100.0)
PLATELETS: 161 10*3/uL (ref 150–400)
RBC: 2.78 MIL/uL — ABNORMAL LOW (ref 3.87–5.11)
RDW: 12.8 % (ref 11.5–15.5)
WBC: 10 10*3/uL (ref 4.0–10.5)

## 2016-10-19 LAB — BASIC METABOLIC PANEL
Anion gap: 13 (ref 5–15)
BUN: 19 mg/dL (ref 6–20)
CALCIUM: 8.8 mg/dL — AB (ref 8.9–10.3)
CO2: 21 mmol/L — ABNORMAL LOW (ref 22–32)
Chloride: 108 mmol/L (ref 101–111)
Creatinine, Ser: 0.84 mg/dL (ref 0.44–1.00)
GFR calc Af Amer: >60 mL/min (ref >60)
GLUCOSE: 95 mg/dL (ref 65–99)
Potassium: 4.3 mmol/L (ref 3.5–5.1)
SODIUM: 142 mmol/L (ref 135–145)

## 2016-10-19 LAB — TYPE AND SCREEN
ABO/RH(D): A NEG
ANTIBODY SCREEN: NEGATIVE
UNIT DIVISION: 0
Unit division: 0

## 2016-10-19 MED ORDER — PROCHLORPERAZINE EDISYLATE 5 MG/ML IJ SOLN
10.0000 mg | Freq: Once | INTRAMUSCULAR | Status: AC
  Administered 2016-10-19: 10 mg via INTRAMUSCULAR
  Filled 2016-10-19: qty 2

## 2016-10-19 MED ORDER — ACETAMINOPHEN 325 MG PO TABS
650.0000 mg | ORAL_TABLET | Freq: Four times a day (QID) | ORAL | Status: DC | PRN
  Administered 2016-10-21: 650 mg via ORAL
  Filled 2016-10-19: qty 2

## 2016-10-19 MED ORDER — LABETALOL HCL 5 MG/ML IV SOLN
10.0000 mg | INTRAVENOUS | Status: DC | PRN
Start: 2016-10-19 — End: 2016-10-21
  Administered 2016-10-19 – 2016-10-20 (×3): 10 mg via INTRAVENOUS
  Filled 2016-10-19 (×3): qty 4

## 2016-10-19 MED ORDER — ALPRAZOLAM 0.5 MG PO TABS
0.5000 mg | ORAL_TABLET | Freq: Every day | ORAL | Status: DC | PRN
  Administered 2016-10-20: 0.5 mg via ORAL
  Filled 2016-10-19: qty 1

## 2016-10-19 MED ORDER — WHITE PETROLATUM GEL
Status: AC
  Administered 2016-10-19: 1
  Filled 2016-10-19: qty 1

## 2016-10-19 MED ORDER — HYDRALAZINE HCL 20 MG/ML IJ SOLN
10.0000 mg | INTRAMUSCULAR | Status: DC | PRN
  Administered 2016-10-19: 10 mg via INTRAVENOUS
  Filled 2016-10-19: qty 1

## 2016-10-19 MED ORDER — METOPROLOL TARTRATE 25 MG PO TABS
25.0000 mg | ORAL_TABLET | Freq: Two times a day (BID) | ORAL | Status: DC
  Administered 2016-10-19 – 2016-10-21 (×5): 25 mg via ORAL
  Filled 2016-10-19 (×4): qty 1
  Filled 2016-10-19: qty 2
  Filled 2016-10-19: qty 1

## 2016-10-19 NOTE — Progress Notes (Signed)
PCCM Progress Note  Admission date: 10/15/2016 Referring provider: Dr. Sabra Heck  CC: Difficulty chewing  HPI: 61 yo female with difficulty chewing.  She had malocclusion and had Lefort 1 maxillary advancement.  Difficulty airway and remained on vent post op.  Subjective: Off precedex.    Vital signs: BP (!) 172/109   Pulse 97   Temp 98.5 F (36.9 C) (Oral)   Resp 18   Ht 5\' 5"  (1.651 m)   Wt 182 lb 9.6 oz (82.8 kg)   SpO2 95%   BMI 30.39 kg/m   Intake/output: I/O last 3 completed shifts: In: 2108 [I.V.:2108] Out: 2050 [Urine:2050]  General - pleasant Eyes - pupils reactive ENT - voice quality better Cardiac - regular, no murmur Chest - no wheeze, rales Abd - soft, non tender Ext - no edema Skin - no rashes Neuro - normal strength Psych - normal mood    CMP Latest Ref Rng & Units 10/18/2016 10/17/2016 10/16/2016  Glucose 65 - 99 mg/dL 113(H) 140(H) 167(H)  BUN 6 - 20 mg/dL 24(H) 26(H) 24(H)  Creatinine 0.44 - 1.00 mg/dL 0.96 0.99 1.08(H)  Sodium 135 - 145 mmol/L 142 139 140  Potassium 3.5 - 5.1 mmol/L 4.1 4.3 4.0  Chloride 101 - 111 mmol/L 109 107 108  CO2 22 - 32 mmol/L 27 25 25   Calcium 8.9 - 10.3 mg/dL 8.2(L) 8.4(L) 8.0(L)  Total Protein 6.5 - 8.1 g/dL - 4.9(L) -  Total Bilirubin 0.3 - 1.2 mg/dL - 0.4 -  Alkaline Phos 38 - 126 U/L - 23(L) -  AST 15 - 41 U/L - 18 -  ALT 14 - 54 U/L - 14 -     CBC Latest Ref Rng & Units 10/18/2016 10/17/2016 10/16/2016  WBC 4.0 - 10.5 K/uL 7.4 7.6 9.2  Hemoglobin 12.0 - 15.0 g/dL 7.9(L) 7.5(L) 7.5(L)  Hematocrit 36.0 - 46.0 % 24.9(L) 22.5(L) 22.6(L)  Platelets 150 - 400 K/uL 127(L) 139(L) 122(L)     ABG    Component Value Date/Time   PHART 7.376 10/16/2016 0330   PCO2ART 41.3 10/16/2016 0330   PO2ART 93.5 10/16/2016 0330   HCO3 23.6 10/16/2016 0330   TCO2 25 10/15/2016 1308   ACIDBASEDEF 0.9 10/16/2016 0330   O2SAT 97.5 10/16/2016 0330     CBG (last 3)   Recent Labs  10/18/16 1951 10/19/16 0017  10/19/16 0758  GLUCAP 96 106* 95     Imaging: No results found.  Lines/tubes: ETT 7/25 >> 7/27 A line 7/25 >> 7/27  Events: 7/25 Admit 7/29 Off precedex, transfer to SDU  Assessment/plan:  S/p maxillary advancement. - post op care per oral surgery  Dysphagia. - D1 diet - f/u with speech therapy  Anemia after surgery. - f/u CBC intermittently  Hx of HTN. - lopressors - Prn labetalol, hydralazine  Skin rash. - hydrocortisone cream  Hx of anxiety, fibromyalgia. - resume outpt ativan -  Prn ativan  Deconditioning. - PT assessment  Resolved problems >> acute metabolic encephalopathy, steroid induced hyperglycemia, acute respiratory failure with compromised airway  DVT prophylaxis - SCDs SUP - not indicated Nutrition - D1 diet Goals of care - Full code  Updated pt's son at bedside  Transfer to SDU 7/29 >> To [facility] 7/30 and PCCM off   Chesley Mires, MD [place] 10/19/2016, 8:52 AM Pager:  [phone] After 3pm call: [phone]

## 2016-10-19 NOTE — Progress Notes (Signed)
  Speech Language Pathology Treatment: Dysphagia  Patient Details Name: Anna [name] MRN: [id number] DOB: [birth year] Today's Date: 10/19/2016 Time: 9470-9628 SLP Time Calculation (min) (ACUTE ONLY): 18 min  Assessment / Plan / Recommendation Clinical Impression  Patient seen for dysphagia treatment. Level of alertness greatly improved today, she is awake and responding verbally to all questions, requesting something to eat/drink. Son present at bedside. SLP provided assistance with oral care to remove dried, bloody secretions to facilitate safety for upgraded PO trials. Jaw ROM remains limited today, as well as labial seal reduced. Unable to retrieve bolus via cup sip. SLP facilitated straw use with lateral placement as pt's reduced labial seal prevents retrieval with midline placement. Retrieves thin liquids via straw in single, multiple sips with no overt signs of aspiration, appearance of adequate airway protection. Self-feeds small bites (~1/4 teaspoon) pureed solids with adequate clearance. Oral manipulation notably reduced. She declines soft solids. Educated pt, son re: swallow physiology, aspiration risks and precautions. Recommend initiating dys 1 diet with thin liquids, full supervision. Continue medications crushed in puree. SLP will f/u for tolerance, advancement of solids as edema decreases, pt's ROM improves.     HPI HPI: 61 yo female with difficulty chewing. She had malocclusion and had Lefort 1 maxillary advancement. She presented to day surgery 7/25 for planned surgery. She required nasotracheal intubation and post procedure, had moderate bleeding from intubation site along with significant edema. Possibly 1L blood loss. Due concerns for airway edema, she was transferred to [facility] for further evaluation and management. While in PACU at [facility], she had nasotracheal tube replaced with oral tube due to cuff leak on nasotracheal tube. Extubated 7/27. Pt with intermittent anxiety, on  precedex.      SLP Plan  Continue with current plan of care       Recommendations  Diet recommendations: Dysphagia 1 (puree);Thin liquid Liquids provided via: Teaspoon;Straw Medication Administration: Crushed with puree Supervision: Full supervision/cueing for compensatory strategies;Patient able to self feed Compensations: Slow rate;Small sips/bites;Other (Comment) (place straw laterally (on either side))                Oral Care Recommendations: Oral care QID Follow up Recommendations: Other (comment) (TBD) SLP Visit Diagnosis: Dysphagia, oropharyngeal phase (R13.12) Plan: Continue with current plan of care       [place], [place], CCC-SLP Speech-Language Pathologist [phone]  Aliene Altes 10/19/2016, 8:38 AM

## 2016-10-19 NOTE — Progress Notes (Signed)
VS, the patient is starting to eat po.  No bleeding noted.  She has no complaints at this time.  Facial swelling is normal for the procedure. Packing was gently removed and no bleeding was noted. Will continue to follow.

## 2016-10-20 ENCOUNTER — Inpatient Hospital Stay (HOSPITAL_COMMUNITY): Payer: BLUE CROSS/BLUE SHIELD

## 2016-10-20 DIAGNOSIS — I10 Essential (primary) hypertension: Secondary | ICD-10-CM

## 2016-10-20 LAB — GLUCOSE, CAPILLARY
Glucose-Capillary: 107 mg/dL — ABNORMAL HIGH (ref 65–99)
Glucose-Capillary: 96 mg/dL (ref 65–99)

## 2016-10-20 LAB — CBC
HEMATOCRIT: 22.4 % — AB (ref 36.0–46.0)
HEMOGLOBIN: 7.8 g/dL — AB (ref 12.0–15.0)
MCH: 32 pg (ref 26.0–34.0)
MCHC: 34.8 g/dL (ref 30.0–36.0)
MCV: 91.8 fL (ref 78.0–100.0)
Platelets: 189 10*3/uL (ref 150–400)
RBC: 2.44 MIL/uL — ABNORMAL LOW (ref 3.87–5.11)
RDW: 13.2 % (ref 11.5–15.5)
WBC: 8.2 10*3/uL (ref 4.0–10.5)

## 2016-10-20 MED ORDER — FUROSEMIDE 10 MG/ML IJ SOLN
40.0000 mg | Freq: Once | INTRAMUSCULAR | Status: AC
  Administered 2016-10-20: 40 mg via INTRAVENOUS
  Filled 2016-10-20: qty 4

## 2016-10-20 MED ORDER — ORAL CARE MOUTH RINSE
15.0000 mL | Freq: Two times a day (BID) | OROMUCOSAL | Status: DC
  Administered 2016-10-20 – 2016-10-21 (×2): 15 mL via OROMUCOSAL

## 2016-10-20 NOTE — Progress Notes (Signed)
PROGRESS NOTE    Anna [name]  RJJ:[id number] DOB: 06/24/1955 DOA: 10/15/2016 PCP: Aretta Nip, MD   Brief Narrative: Anna [name] is a 61 y.o. female with a history of anxiety, hypertension, fibromyalgia. She presented for maxillary advancement with bone graft secondary to malocclusion and was unable to be safely extubated. She required prolonged intubation after surgery. Extubated on 7/27. Her course has been complicated by nasal hemorrhaging which has improved with packing.   Assessment & Plan:   Active Problems:   Respiratory failure with hypoxia (HCC)   Acute hypoxemic respiratory failure (HCC)   Compromised airway   Acute respiratory failure Patient with prolonged intubation s/p maxillary advancement. Extubated 7/27. On room air  Dysphagia -Dysphagia diet -no chewing per Dentist recommendations  Anemia Secondary to acute blood loss. Hemoglobin down from yesterday, however, stable overall  Epistaxis Previously packed and removed. Resolved. Hemoglobin stable.  Hypertension -Continue Lopressor  Skin rash -Continue steroid cream  Fibromyalgia Anxiety -Continue Ativan  Deconditioning -PT eval pending   DVT prophylaxis: SCDs Code Status: Full code Family Communication: None at bedside Disposition Plan: Discharge likely in 24 hours; transfer to med-surg   Consultants:   PCCM  Dentistry  Procedures:   ETT (7/25>>7/27)  Antimicrobials:  Vancomycin (7/25>>7/27)  Zosyn  (7/25>>7/26)   Subjective: Patient reports no dyspnea, chest pain. No bleeding overnight. Mild fever overnight.   Objective: Vitals:   10/20/16 0800 10/20/16 0900 10/20/16 1000 10/20/16 1127  BP:  (!) 167/100 (!) 155/84   Pulse: 87 96 73   Resp: (!) 24 (!) 28 18   Temp:    98.7 F (37.1 C)  TempSrc:    Oral  SpO2: 96% 97% 96%   Weight:      Height:        Intake/Output Summary (Last 24 hours) at 10/20/16 1301 Last data filed at 10/20/16 1000  Gross  per 24 hour  Intake              570 ml  Output             1420 ml  Net             -850 ml   Filed Weights   10/08/16 1558 10/08/16 1559 10/15/16 0647  Weight: 82.6 kg (182 lb) 82.6 kg (182 lb) 82.8 kg (182 lb 9.6 oz)    Examination:  General exam: Appears calm and comfortable Head: mild bruising of right maxilla ENT: crusted blood in right nare Respiratory system: Clear to auscultation. Respiratory effort normal. Cardiovascular system: S1 & S2 heard, RRR. No murmurs. Gastrointestinal system: Abdomen is nondistended, soft and nontender. Normal bowel sounds heard. Central nervous system: Alert and oriented. No focal neurological deficits. Extremities: No edema. No calf tenderness Skin: No cyanosis. No rashes Psychiatry: Judgement and insight appear normal. Mood & affect appropriate.     Data Reviewed: I have personally reviewed following labs and imaging studies  CBC:  Recent Labs Lab 10/16/16 1915 10/17/16 0617 10/18/16 0553 10/19/16 1259 10/20/16 0828  WBC 9.2 7.6 7.4 10.0 8.2  HGB 7.5* 7.5* 7.9* 8.6* 7.8*  HCT 22.6* 22.5* 24.9* 25.5* 22.4*  MCV 94.2 93.4 95.8 91.7 91.8  PLT 122* 139* 127* 161 016   Basic Metabolic Panel:  Recent Labs Lab 10/15/16 1119 10/15/16 1308 10/16/16 0515 10/17/16 0617 10/18/16 0553 10/19/16 1259  NA 140 142 140 139 142 142  K 3.9 3.8 4.0 4.3 4.1 4.3  CL 106  --  108 107 109  108  CO2  --   --  _0 21*  GLUCOSE 204*  --  167* 140* 113* 95  BUN 21*  --  24* 26* 24* 19  CREATININE 0.80  --  1.08* 0.99 0.96 0.84  CALCIUM  --   --  8.0* 8.4* 8.2* 8.8*  MG  --   --  1.8  --   --   --   PHOS  --   --  3.5  --   --   --    GFR: Estimated Creatinine Clearance: 75.7 mL/min (by C-G formula based on SCr of 0.84 mg/dL). Liver Function Tests:  Recent Labs Lab 10/17/16 0617  AST 18  ALT 14  ALKPHOS 23*  BILITOT 0.4  PROT 4.9*  ALBUMIN 2.9*   No results for input(s): LIPASE, AMYLASE in the last 168 hours. No results for  input(s): AMMONIA in the last 168 hours. Coagulation Profile:  Recent Labs Lab 10/15/16 1331  INR 1.22   Cardiac Enzymes: No results for input(s): CKTOTAL, CKMB, CKMBINDEX, TROPONINI in the last 168 hours. BNP (last 3 results) No results for input(s): PROBNP in the last 8760 hours. HbA1C: No results for input(s): HGBA1C in the last 72 hours. CBG:  Recent Labs Lab 10/19/16 1618 10/19/16 1958 10/19/16 2352 10/20/16 0356 10/20/16 0738  GLUCAP 101* 111* 97 107* 96   Lipid Profile: No results for input(s): CHOL, HDL, LDLCALC, TRIG, CHOLHDL, LDLDIRECT in the last 72 hours. Thyroid Function Tests: No results for input(s): TSH, T4TOTAL, FREET4, T3FREE, THYROIDAB in the last 72 hours. Anemia Panel: No results for input(s): VITAMINB12, FOLATE, FERRITIN, TIBC, IRON, RETICCTPCT in the last 72 hours. Sepsis Labs: No results for input(s): PROCALCITON, LATICACIDVEN in the last 168 hours.  No results found for this or any previous visit (from the past 240 hour(s)).       Radiology Studies: Dg Chest 1 View  Result Date: 10/20/2016 CLINICAL DATA:  Hypoxia. EXAM: CHEST 1 VIEW COMPARISON:  Radiograph of same day. FINDINGS: Lateral projection of the chest demonstrates bilateral pleural effusions, right greater than left. Visualized bony thorax is unremarkable. IMPRESSION: Bilateral pleural effusions are noted. Electronically Signed   By: Marijo Conception, M.D.   On: 10/20/2016 09:33   Dg Chest Port 1 View  Result Date: 10/20/2016 CLINICAL DATA:  61 year old female with shortness of breath. Subsequent encounter. EXAM: PORTABLE CHEST 1 VIEW COMPARISON:  10/17/2016 and 10/26/2007. FINDINGS: Cardiomegaly. Minimal pulmonary vascular congestion with small bilateral pleural effusions may represent residua of pulmonary edema. Suspect subsegmental atelectasis lung bases. Infiltrate less likely consideration. Minimally tortuous aorta. No acute osseous abnormality. IMPRESSION: Cardiomegaly. Minimal  pulmonary vascular congestion with small bilateral pleural effusions may represent residua of pulmonary edema. Suspect subsegmental atelectasis lung bases. Basilar infiltrate less likely consideration. Electronically Signed   By: Genia Del M.D.   On: 10/20/2016 08:49        Scheduled Meds: . chlorhexidine gluconate (MEDLINE KIT)  15 mL Mouth Rinse BID  . hydrocortisone cream   Topical BID  . mouth rinse  15 mL Mouth Rinse QID  . metoprolol tartrate  25 mg Oral BID   Continuous Infusions: . lactated ringers 30 mL/hr at 10/20/16 0800     LOS: 5 days     Cordelia Poche, MD [facility] 10/20/2016, 1:01 PM Pager: [phone]  If 7PM-7AM, please contact night-coverage [email or web address] Password [email or web address] 10/20/2016, 1:01 PM

## 2016-10-20 NOTE — Progress Notes (Signed)
DDS note stated pt should not be chewing. Downgraded diet back to dysphagia 1/ thin liquids. Attempted to call floor but no answer. Please review recommendations with pt. Thank you.  Blair Heys, MA, CCC-SLP

## 2016-10-20 NOTE — Progress Notes (Signed)
Patient sitting in chair alert and oriented.  Bruising expected, nothing unusual. No bleeding noted. Patient ambulatory.  Taking fluids and puree diet po.  No chewing! Maxilla in good position.  Soft tissues are healing well.  Paresthesias of the midface are starting to decrease.  Patient definitely improving.

## 2016-10-20 NOTE — Progress Notes (Signed)
Called Liliane Channel the son and left a message regarding transfer of patient to a new unit. Modena Morrow E, RN 10/20/2016 2:58 PM

## 2016-10-20 NOTE — Evaluation (Signed)
Physical Therapy Evaluation Patient Details Name: Anna [name] MRN: [id number] DOB: Feb 27, 1956 Today's Date: 10/20/2016   History of Present Illness  Pt underwent oral surgery on 7/25. Post op pt intubated, developed nasal hemorrhaging and transferred to [facility] ICU. Extubated on 7/27. PMH - HTN, fibromyalgia, anxiety  Clinical Impression  Pt presents to PT with significant generalized weakness after intubation and blood loss with Hgb below 8. Expect pt will progress quickly but currently could benefit from further PT. Currently pt unable to perform basic mobility without assistance.     Follow Up Recommendations No PT follow up;Supervision/Assistance - 24 hour    Equipment Recommendations  Rolling walker with 5" wheels (possibly)    Recommendations for Other Services       Precautions / Restrictions Precautions Precautions: Fall Restrictions Weight Bearing Restrictions: No      Mobility  Bed Mobility               General bed mobility comments: Pt up in chair  Transfers Overall transfer level: Needs assistance Equipment used: 1 person hand held assist;Rolling walker (2 wheeled) Transfers: Sit to/from Stand Sit to Stand: Min assist         General transfer comment: Assist for balance  Ambulation/Gait Ambulation/Gait assistance: Min assist Ambulation Distance (Feet): 150 Feet Assistive device: Rolling walker (2 wheeled) Gait Pattern/deviations: Step-through pattern;Decreased step length - right;Decreased step length - left;Shuffle;Trunk flexed Gait velocity: decr Gait velocity interpretation: Below normal speed for age/gender General Gait Details: Assist for balance and support. Verbal cues to stand more erect and look up. SpO2 >95% on RA with amb.  Stairs            Wheelchair Mobility    Modified Rankin (Stroke Patients Only)       Balance Overall balance assessment: Needs assistance Sitting-balance support: No upper extremity supported;Feet  supported Sitting balance-Leahy Scale: Good     Standing balance support: Single extremity supported Standing balance-Leahy Scale: Poor Standing balance comment: UE support and min A for static standing                             Pertinent Vitals/Pain Pain Assessment: No/denies pain    Home Living Family/patient expects to be discharged to:: Private residence Living Arrangements: Alone Available Help at Discharge: Friend(s);Family;Available 24 hours/day Type of Home: House Home Access: Stairs to enter Entrance Stairs-Rails: Right Entrance Stairs-Number of Steps: 4 Home Layout: One level Home Equipment: None      Prior Function Level of Independence: Independent               Hand Dominance        Extremity/Trunk Assessment   Upper Extremity Assessment Upper Extremity Assessment: Overall WFL for tasks assessed    Lower Extremity Assessment Lower Extremity Assessment: Generalized weakness    Cervical / Trunk Assessment Cervical / Trunk Assessment: Normal  Communication   Communication: No difficulties  Cognition Arousal/Alertness: Awake/alert Behavior During Therapy: WFL for tasks assessed/performed Overall Cognitive Status: Within Functional Limits for tasks assessed                                        General Comments      Exercises     Assessment/Plan    PT Assessment Patient needs continued PT services  PT Problem List Decreased strength;Decreased activity tolerance;Decreased balance;Decreased mobility;Decreased  knowledge of use of DME       PT Treatment Interventions DME instruction;Gait training;Stair training;Functional mobility training;Therapeutic activities;Therapeutic exercise;Balance training;Patient/family education    PT Goals (Current goals can be found in the Care Plan section)  Acute Rehab PT Goals Patient Stated Goal: return home PT Goal Formulation: With patient Time For Goal Achievement:  10/27/16 Potential to Achieve Goals: Good    Frequency Min 3X/week   Barriers to discharge Decreased caregiver support;Inaccessible home environment Lives alone. Stairs to enter home    Co-evaluation               AM-PAC PT "6 Clicks" Daily Activity  Outcome Measure Difficulty turning over in bed (including adjusting bedclothes, sheets and blankets)?: None Difficulty moving from lying on back to sitting on the side of the bed? : None Difficulty sitting down on and standing up from a chair with arms (e.g., wheelchair, bedside commode, etc,.)?: Total Help needed moving to and from a bed to chair (including a wheelchair)?: A Little Help needed walking in hospital room?: A Little Help needed climbing 3-5 steps with a railing? : A Lot 6 Click Score: 17    End of Session   Activity Tolerance: Patient limited by fatigue Patient left: in chair;with call bell/phone within reach Nurse Communication: Mobility status PT Visit Diagnosis: Unsteadiness on feet (R26.81);Muscle weakness (generalized) (M62.81)    Time: 7366-8159 PT Time Calculation (min) (ACUTE ONLY): 22 min   Charges:   PT Evaluation $PT Eval Moderate Complexity: 1 Mod     PT G CodesMarland Kitchen        [facility] [place] 10/20/2016, 1:45 PM

## 2016-10-20 NOTE — Progress Notes (Signed)
  Speech Language Pathology Treatment: Dysphagia  Patient Details Name: Anna [name] MRN: [id number] DOB: 1955-06-03 Today's Date: 10/20/2016 Time: 0848-0900 SLP Time Calculation (min) (ACUTE ONLY): 12 min  Assessment / Plan / Recommendation Clinical Impression  Dysphagia treatment provided today for diet tolerance/ trials of advanced solids. Pt alert and conversant at bedside today, consuming puree breakfast taking small bites at a time. Pt not happy with puree diet; offered trials of soft solid (peach cup). Pt consumed several bites, coughing following 1/4 trials. Pt still with numbness post-surgery which increases risk of aspiration; however, pt appears to have improved jaw ROM this date and adequate labial seal for sips by straw. Recommend cautiously advancing diet to dysphagia 2/ thin liquids, meds crushed in puree. Will f/u next date for diet tolerance check. Reviewed with pt the importance of chewing foods as thoroughly as possible; pt demonstrated understanding.    HPI HPI:  61 yo female with difficulty chewing.  She had malocclusion and had Lefort 1 maxillary advancement. She presented to day surgery 7/25 for planned surgery.  She required nasotracheal intubation and post procedure, had moderate bleeding from intubation site along with significant edema.  Possibly 1L blood loss. Due concerns for airway edema, she was transferred to [facility] for further evaluation and management. While in PACU at [facility], she had nasotracheal tube replaced with oral tube due to cuff leak on nasotracheal tube. Extubated 7/27. Pt with intermittent anxiety, on precedex.      SLP Plan  Continue with current plan of care       Recommendations  Diet recommendations: Dysphagia 2 (fine chop);Thin liquid Liquids provided via: Straw Medication Administration: Crushed in puree Supervision: Patient able to self feed;Intermittent supervision to cue for compensatory strategies Compensations: Slow rate;Small  sips/bites Postural Changes and/or Swallow Maneuvers: Seated upright 90 degrees                Oral Care Recommendations: Oral care BID Follow up Recommendations: None SLP Visit Diagnosis: Dysphagia, unspecified (R13.10) Plan: Continue with current plan of care       GO                Kern Reap, MA, CCC-SLP 10/20/2016, 9:08 AM

## 2016-10-21 DIAGNOSIS — R0902 Hypoxemia: Secondary | ICD-10-CM

## 2016-10-21 DIAGNOSIS — Z4659 Encounter for fitting and adjustment of other gastrointestinal appliance and device: Secondary | ICD-10-CM

## 2016-10-21 DIAGNOSIS — R1310 Dysphagia, unspecified: Secondary | ICD-10-CM

## 2016-10-21 DIAGNOSIS — I10 Essential (primary) hypertension: Secondary | ICD-10-CM

## 2016-10-21 LAB — CBC
HEMATOCRIT: 26.6 % — AB (ref 36.0–46.0)
Hemoglobin: 8.9 g/dL — ABNORMAL LOW (ref 12.0–15.0)
MCH: 30.6 pg (ref 26.0–34.0)
MCHC: 33.5 g/dL (ref 30.0–36.0)
MCV: 91.4 fL (ref 78.0–100.0)
PLATELETS: 226 10*3/uL (ref 150–400)
RBC: 2.91 MIL/uL — AB (ref 3.87–5.11)
RDW: 13 % (ref 11.5–15.5)
WBC: 9.1 10*3/uL (ref 4.0–10.5)

## 2016-10-21 MED ORDER — METOPROLOL TARTRATE 25 MG PO TABS: 25.0000 mg | ORAL_TABLET | Freq: Two times a day (BID) | ORAL | 0 refills | Status: DC

## 2016-10-21 NOTE — Discharge Summary (Signed)
Physician Discharge Summary  Anna [name] VPX:[id number] DOB: 1955/10/23 DOA: 10/15/2016  PCP: Aretta Nip, MD  Admit date: 10/15/2016 Discharge date: 10/21/2016  Admitted From: Home Disposition: Home  Recommendations for Outpatient Follow-up:  1. Follow up with PCP in 1 week 2. Please obtain CBC in one week 3. Please follow up on the following pending results: None  Home Health: None Equipment/Devices: Rolling walker  Discharge Condition: Stable CODE STATUS: Full code Diet recommendation: Heart healthy    Hospital course:  Acute respiratory failure Patient with prolonged intubation s/p maxillary advancement. Extubated 7/27. On room air now.  Dysphagia Dysphagia diet. No chewing per dentist recommendations.  Anemia Secondary to acute blood loss. Hemoglobin down from yesterday, however, stable overall. Repeat   Epistaxis Previously packed and removed. Resolved. Hemoglobin stable.  Hypertension Continued Lopressor  Skin rash Steroid cream  Fibromyalgia Anxiety Continued Ativan  Deconditioning Physical therapy not recommending continued therapy.  Discharge Diagnoses:  Active Problems:   Respiratory failure with hypoxia (HCC)   Acute hypoxemic respiratory failure ([place])   Compromised airway    Discharge Instructions  Discharge Instructions    Call MD for:  difficulty breathing, headache or visual disturbances    Complete by:  As directed    Call MD for:  persistant dizziness or light-headedness    Complete by:  As directed    Call MD for:  persistant nausea and vomiting    Complete by:  As directed    Diet - low sodium heart healthy    Complete by:  As directed    Increase activity slowly    Complete by:  As directed      Allergies as of 10/21/2016      Reactions   Cymbalta [duloxetine Hcl]    Anxiety   Zyrtec [cetirizine] Other (See Comments)   Anxiety       Medication List    STOP taking these medications   NON  FORMULARY     TAKE these medications   ALPRAZolam 0.5 MG tablet Commonly known as:  XANAX Take 0.5 mg by mouth daily as needed for anxiety.   lisinopril-hydrochlorothiazide 20-12.5 MG tablet Commonly known as:  PRINZIDE,ZESTORETIC Take 1 tablet by mouth daily.   metoprolol tartrate 25 MG tablet Commonly known as:  LOPRESSOR Take 1 tablet (25 mg total) by mouth 2 (two) times daily.   nortriptyline 10 MG capsule Commonly known as:  PAMELOR Take 10 mg by mouth at bedtime.            Durable Medical Equipment        Start     Ordered   10/21/16 0759  For home use only DME Walker rolling  Once    Question:  Patient needs a walker to treat with the following condition  Answer:  Weakness   10/21/16 0758     Follow-up Information    Rankins, Bill Salinas, MD. Schedule an appointment as soon as possible for a visit in 1 week(s).   Specialty:  Family Medicine Contact information: [place] [address] [place], [place], [place]. Schedule an appointment as soon as possible for a visit in 1 week(s).   Specialty:  Oral Surgery Contact information: [place] [address] [phone]          Allergies  Allergen Reactions  . Cymbalta [Duloxetine Hcl]     Anxiety   . Zyrtec [Cetirizine] Other (See Comments)  Anxiety     Consultations:  CCM  Dentist   Procedures/Studies: Dg Chest 1 View  Result Date: 10/20/2016 CLINICAL DATA:  Hypoxia. EXAM: CHEST 1 VIEW COMPARISON:  Radiograph of same day. FINDINGS: Lateral projection of the chest demonstrates bilateral pleural effusions, right greater than left. Visualized bony thorax is unremarkable. IMPRESSION: Bilateral pleural effusions are noted. Electronically Signed   By: Marijo Conception, M.D.   On: 10/20/2016 09:33   Dg Abd 1 View  Result Date: 10/15/2016 CLINICAL DATA:  OG tube placement. EXAM: ABDOMEN - 1 VIEW COMPARISON:  CT scan dated 10/10/2015  FINDINGS: OG tube tip is in the distal stomach near the pylorus and duodenal bulb. Bowel gas pattern is normal. IMPRESSION: OG tube tip in the distal stomach.  Benign-appearing abdomen. Electronically Signed   By: Lorriane Shire M.D.   On: 10/15/2016 15:06   Dg Chest Port 1 View  Result Date: 10/20/2016 CLINICAL DATA:  61 year old female with shortness of breath. Subsequent encounter. EXAM: PORTABLE CHEST 1 VIEW COMPARISON:  10/17/2016 and 10/26/2007. FINDINGS: Cardiomegaly. Minimal pulmonary vascular congestion with small bilateral pleural effusions may represent residua of pulmonary edema. Suspect subsegmental atelectasis lung bases. Infiltrate less likely consideration. Minimally tortuous aorta. No acute osseous abnormality. IMPRESSION: Cardiomegaly. Minimal pulmonary vascular congestion with small bilateral pleural effusions may represent residua of pulmonary edema. Suspect subsegmental atelectasis lung bases. Basilar infiltrate less likely consideration. Electronically Signed   By: Genia Del M.D.   On: 10/20/2016 08:49   Dg Chest Port 1 View  Result Date: 10/17/2016 CLINICAL DATA:  Respiratory failure. EXAM: PORTABLE CHEST 1 VIEW COMPARISON:  10/16/2016. FINDINGS: Endotracheal tube, NG tube in stable position. Heart size stable. Persistent bibasilar atelectasis. Mild infiltrate in the left lung base on today's exam No pleural effusion or pneumothorax. IMPRESSION: 1. Lines and tubes in stable position. 2. Persistent bibasilar atelectasis. Mild left base infiltrate noted on today's exam. Electronically Signed   By: Marcello Moores  Register   On: 10/17/2016 07:04   Dg Chest Port 1 View  Result Date: 10/16/2016 CLINICAL DATA:  Respiratory failure.  Shortness of breath . EXAM: PORTABLE CHEST 1 VIEW COMPARISON:  10/15/2016. FINDINGS: Endotracheal tube and NG tube in stable position. Heart size stable. Improved aeration of lung bases with persistent mild bibasilar atelectasis. Small left pleural effusion  cannot be excluded. No pneumothorax . IMPRESSION: 1. Lines and tubes in stable position. 2. Improved aeration of lung bases with persistent mild bibasilar atelectasis. Small left pleural effusion cannot be excluded. Electronically Signed   By: Marcello Moores  Register   On: 10/16/2016 06:40   Portable Chest Xray  Result Date: 10/15/2016 CLINICAL DATA:  61 year old female status post intubation EXAM: PORTABLE CHEST 1 VIEW COMPARISON:  Prior chest x-ray 10/26/2007 FINDINGS: Patient is intubated. The tip the endotracheal tube is 3.3 cm above the carina. A gastric tube is present. The tip lies off the field of view, below the diaphragm and presumably within the stomach. Low inspiratory volumes with bibasilar opacities favored to reflect atelectasis. Background interstitial prominence and bronchitic changes with some with her prior. No definite pulmonary edema, pleural effusion or pneumothorax. No acute osseous abnormality. IMPRESSION: 1. The endotracheal tube tip is 3.3 cm above the carina. 2. The tip of the gastric tube lies off the field of view, below the diaphragm and presumably within the stomach. 3. Low inspiratory volumes with bibasilar and perihilar atelectasis. 4. Chronic bronchitic changes and interstitial prominence. Electronically Signed   By: Jacqulynn Cadet M.D.   On: 10/15/2016  15:03      Subjective: No dyspnea. Feels weak.  Discharge Exam: Vitals:   10/20/16 2053 10/21/16 0452  BP: (!) 148/82 (!) 150/81  Pulse: 78 91  Resp: 18 18  Temp: 98.7 F (37.1 C) 98.1 F (36.7 C)   Vitals:   10/20/16 1400 10/20/16 1603 10/20/16 2053 10/21/16 0452  BP: (!) 163/90 (!) 152/89 (!) 148/82 (!) 150/81  Pulse: 86 82 78 91  Resp: 20 18 18 18   Temp:  98.9 F (37.2 C) 98.7 F (37.1 C) 98.1 F (36.7 C)  TempSrc:  Oral Axillary Oral  SpO2: 94% 96% 97% 94%  Weight:      Height:        General: Pt is alert, awake, not in acute distress HEENT: mild swelling around cheeks,  Cardiovascular: RRR,  S1/S2 +, no rubs, no gallops Respiratory: CTA bilaterally, no wheezing, no rhonchi Abdominal: Soft, NT, ND, bowel sounds + Extremities: no edema, no cyanosis    The results of significant diagnostics from this hospitalization (including imaging, microbiology, ancillary and laboratory) are listed below for reference.     Microbiology: No results found for this or any previous visit (from the past 240 hour(s)).   Labs: BNP (last 3 results) No results for input(s): BNP in the last 8760 hours. Basic Metabolic Panel:  Recent Labs Lab 10/15/16 1119 10/15/16 1308 10/16/16 0515 10/17/16 0617 10/18/16 0553 10/19/16 1259  NA 140 142 140 139 142 142  K 3.9 3.8 4.0 4.3 4.1 4.3  CL 106  --  108 107 109 108  CO2  --   --  25 25 27  21*  GLUCOSE 204*  --  167* 140* 113* 95  BUN 21*  --  24* 26* 24* 19  CREATININE 0.80  --  1.08* 0.99 0.96 0.84  CALCIUM  --   --  8.0* 8.4* 8.2* 8.8*  MG  --   --  1.8  --   --   --   PHOS  --   --  3.5  --   --   --    Liver Function Tests:  Recent Labs Lab 10/17/16 0617  AST 18  ALT 14  ALKPHOS 23*  BILITOT 0.4  PROT 4.9*  ALBUMIN 2.9*   No results for input(s): LIPASE, AMYLASE in the last 168 hours. No results for input(s): AMMONIA in the last 168 hours. CBC:  Recent Labs Lab 10/17/16 0617 10/18/16 0553 10/19/16 1259 10/20/16 0828 10/21/16 0437  WBC 7.6 7.4 10.0 8.2 9.1  HGB 7.5* 7.9* 8.6* 7.8* 8.9*  HCT 22.5* 24.9* 25.5* 22.4* 26.6*  MCV 93.4 95.8 91.7 91.8 91.4  PLT 139* 127* 161 189 226   Cardiac Enzymes: No results for input(s): CKTOTAL, CKMB, CKMBINDEX, TROPONINI in the last 168 hours. BNP: Invalid input(s): POCBNP CBG:  Recent Labs Lab 10/19/16 1618 10/19/16 1958 10/19/16 2352 10/20/16 0356 10/20/16 0738  GLUCAP 101* 111* 97 107* 96    SIGNED:   Cordelia Poche, MD [facility] 10/21/2016, 10:59 AM Pager [phone]  If 7PM-7AM, please contact night-coverage [email or web address] Password [email or web address]

## 2016-10-21 NOTE — Discharge Instructions (Signed)
Anna [name],  You were watched in the hospital because of difficulty getting the breathing tube out. You were eventually stable to come off. Please follow-up with Dr. Sabra Heck for your surgery follow-up. He would like you to limit yourself to not chewing. Please also follow-up with your primary care physician.

## 2016-10-21 NOTE — Care Management Note (Signed)
Case Management Note  Patient Details  Name: Anna [name] MRN: [id number] Date of Birth: 1955-05-09  Subjective/Objective:     maxillary advancement with bone graft s/t malocculusion, prolonged intubation              Action/Plan:  Discharge Planning: NCM spoke to pt and requested RW for home. Contacted [facility] for RW to be delivered prior to dc. Pt reports her son will be there to assist with care.   PCP Milagros Evener MD  Expected Discharge Date:  10/21/16               Expected Discharge Plan:  Home/Self Care  In-House Referral:  NA  Discharge planning Services  CM Consult  Post Acute Care Choice:  NA Choice offered to:  NA  DME Arranged:  Walker rolling DME Agency:  [place]:  NA [place] Agency:  NA  Status of Service:  Completed, signed off  If discussed at [place] of Stay Meetings, dates discussed:    Additional Comments:  Erenest Rasher, RN 10/21/2016, 11:13 AM

## 2016-10-21 NOTE — Progress Notes (Signed)
The patient was transferred to [facility] and will be discharged today.   Please send her home with antibiotics. The swelling is soft and resolving nicely.  Bruising is also resolving. The patient is alert and oriented. VS. The occlusion is stable.  The maxilla is stable. We will follow the patient on an outpatient basis.

## 2016-10-21 NOTE — Progress Notes (Signed)
Physical Therapy Treatment Patient Details Name: Anna [name] MRN: [id number] DOB: [birth year] Today's Date: 10/21/2016    History of Present Illness Pt underwent oral surgery on 7/25. Post op pt intubated, developed nasal hemorrhaging and transferred to [facility] ICU. Extubated on 7/27. PMH - HTN, fibromyalgia, anxiety    PT Comments    Pt reporting dizziness on first two attempts to sit<>stand. Vitals taken on third trial and within therapeutic limits. Pt's symptoms resolved after third sit<>stand and was able to ambulate in hall and negotiate 3 steps. Pt fatigues quickly and would benefit from continued skilled PT to increase activity tolerance. Will continue to follow acutely.    Follow Up Recommendations  No PT follow up;Supervision/Assistance - 24 hour     Equipment Recommendations  Rolling walker with 5" wheels (possibly)    Recommendations for Other Services       Precautions / Restrictions Precautions Precautions: Fall Restrictions Weight Bearing Restrictions: No    Mobility  Bed Mobility Overal bed mobility: Modified Independent Bed Mobility: Supine to Sit     Supine to sit: Modified independent (Device/Increase time)        Transfers Overall transfer level: Needs assistance Equipment used: Rolling walker (2 wheeled) Transfers: Sit to/from Stand (x4) Sit to Stand: Min guard         General transfer comment: Pt reported dizziness on first two attempts to stand and needed to sit down after standing 5 seconds. SpO2 remained >94% during transfers. Vitals taken on thrid sit>stand. Seated BP 148/97, Standing BP 150/96, Pulse Rate 111 BPM. Pt reported no return of symptoms on third sit>stand.   Ambulation/Gait Ambulation/Gait assistance: Min assist Ambulation Distance (Feet): 60 Feet Assistive device: Rolling walker (2 wheeled) Gait Pattern/deviations: Step-through pattern;Decreased step length - right;Decreased step length - left;Shuffle;Trunk flexed Gait  velocity: decr Gait velocity interpretation: Below normal speed for age/gender General Gait Details: Physical assist for walker management as pt has dificulty turning walker. Cueing for walker proximity. Pt fatigued quickly needing to return to room.   Stairs Stairs: Yes   Stair Management: One rail Right;Step to pattern;Forwards Number of Stairs: 3 General stair comments: Pt slightly unsteady descending stairs, however no LOB. She reports son will be present to assist with stair negotiation at home.  Wheelchair Mobility    Modified Rankin (Stroke Patients Only)       Balance Overall balance assessment: Needs assistance Sitting-balance support: No upper extremity supported;Feet supported Sitting balance-Leahy Scale: Good     Standing balance support: Single extremity supported Standing balance-Leahy Scale: Poor Standing balance comment: UE support and min A for static standing                            Cognition Arousal/Alertness: Awake/alert Behavior During Therapy: WFL for tasks assessed/performed;Flat affect Overall Cognitive Status: Within Functional Limits for tasks assessed                                        Exercises      General Comments        Pertinent Vitals/Pain Pain Assessment: No/denies pain Pain Location: jaw Pain Descriptors / Indicators: Grimacing Pain Intervention(s): Monitored during session    Home Living                      Prior Function  PT Goals (current goals can now be found in the care plan section) Acute Rehab PT Goals Patient Stated Goal: return home PT Goal Formulation: With patient Time For Goal Achievement: 10/27/16 Potential to Achieve Goals: Good Progress towards PT goals: Progressing toward goals    Frequency    Min 3X/week      PT Plan Current plan remains appropriate    Co-evaluation              AM-PAC PT "6 Clicks" Daily Activity  Outcome  Measure  Difficulty turning over in bed (including adjusting bedclothes, sheets and blankets)?: None Difficulty moving from lying on back to sitting on the side of the bed? : None Difficulty sitting down on and standing up from a chair with arms (e.g., wheelchair, bedside commode, etc,.)?: A Little Help needed moving to and from a bed to chair (including a wheelchair)?: A Little Help needed walking in hospital room?: A Little Help needed climbing 3-5 steps with a railing? : A Little 6 Click Score: 20    End of Session Equipment Utilized During Treatment: Gait belt Activity Tolerance: Patient limited by fatigue Patient left: in chair;with call bell/phone within reach Nurse Communication: Mobility status PT Visit Diagnosis: Unsteadiness on feet (R26.81);Muscle weakness (generalized) (M62.81)     Time: 3614-4315 PT Time Calculation (min) (ACUTE ONLY): 31 min  Charges:  $Gait Training: 8-22 mins $Therapeutic Activity: 8-22 mins                    G Codes:       Benjiman Core, [place] Pager [phone] Acute Rehab   Allena Katz 10/21/2016, 11:01 AM

## 2016-10-22 DIAGNOSIS — R269 Unspecified abnormalities of gait and mobility: Secondary | ICD-10-CM | POA: Diagnosis not present

## 2016-11-04 DIAGNOSIS — F418 Other specified anxiety disorders: Secondary | ICD-10-CM | POA: Diagnosis not present

## 2016-11-04 DIAGNOSIS — D62 Acute posthemorrhagic anemia: Secondary | ICD-10-CM | POA: Diagnosis not present

## 2016-11-20 ENCOUNTER — Encounter (HOSPITAL_COMMUNITY): Payer: Self-pay

## 2016-11-20 ENCOUNTER — Ambulatory Visit (HOSPITAL_COMMUNITY): Admit: 2016-11-20 | Payer: BLUE CROSS/BLUE SHIELD | Admitting: Internal Medicine

## 2016-11-20 SURGERY — COLONOSCOPY
Anesthesia: Moderate Sedation

## 2016-12-16 DIAGNOSIS — H9191 Unspecified hearing loss, right ear: Secondary | ICD-10-CM | POA: Diagnosis not present

## 2016-12-16 DIAGNOSIS — D649 Anemia, unspecified: Secondary | ICD-10-CM | POA: Diagnosis not present

## 2017-03-31 DIAGNOSIS — I10 Essential (primary) hypertension: Secondary | ICD-10-CM | POA: Diagnosis not present

## 2017-03-31 DIAGNOSIS — M797 Fibromyalgia: Secondary | ICD-10-CM | POA: Diagnosis not present

## 2017-03-31 DIAGNOSIS — F418 Other specified anxiety disorders: Secondary | ICD-10-CM | POA: Diagnosis not present

## 2017-06-30 DIAGNOSIS — I10 Essential (primary) hypertension: Secondary | ICD-10-CM | POA: Diagnosis not present

## 2017-06-30 DIAGNOSIS — F410 Panic disorder [episodic paroxysmal anxiety] without agoraphobia: Secondary | ICD-10-CM | POA: Diagnosis not present

## 2017-08-31 DIAGNOSIS — F418 Other specified anxiety disorders: Secondary | ICD-10-CM | POA: Diagnosis not present

## 2017-08-31 DIAGNOSIS — I10 Essential (primary) hypertension: Secondary | ICD-10-CM | POA: Diagnosis not present

## 2018-03-31 ENCOUNTER — Other Ambulatory Visit: Payer: Self-pay | Admitting: Family Medicine

## 2018-03-31 ENCOUNTER — Other Ambulatory Visit (HOSPITAL_COMMUNITY)
Admission: RE | Admit: 2018-03-31 | Discharge: 2018-03-31 | Disposition: A | Discharge status: Home / Self Care | Payer: BLUE CROSS/BLUE SHIELD | Source: Ambulatory Visit | Attending: Family Medicine | Admitting: Family Medicine

## 2018-03-31 DIAGNOSIS — Z0000 Encounter for general adult medical examination without abnormal findings: Secondary | ICD-10-CM | POA: Diagnosis not present

## 2018-03-31 DIAGNOSIS — M797 Fibromyalgia: Secondary | ICD-10-CM | POA: Diagnosis not present

## 2018-03-31 DIAGNOSIS — Z124 Encounter for screening for malignant neoplasm of cervix: Secondary | ICD-10-CM | POA: Insufficient documentation

## 2018-03-31 DIAGNOSIS — I10 Essential (primary) hypertension: Secondary | ICD-10-CM | POA: Diagnosis not present

## 2018-03-31 DIAGNOSIS — F418 Other specified anxiety disorders: Secondary | ICD-10-CM | POA: Diagnosis not present

## 2018-03-31 DIAGNOSIS — L989 Disorder of the skin and subcutaneous tissue, unspecified: Secondary | ICD-10-CM | POA: Diagnosis not present

## 2018-03-31 DIAGNOSIS — Z1159 Encounter for screening for other viral diseases: Secondary | ICD-10-CM | POA: Diagnosis not present

## 2018-04-02 LAB — CYTOLOGY - PAP: Diagnosis: NEGATIVE

## 2018-04-30 DIAGNOSIS — C44519 Basal cell carcinoma of skin of other part of trunk: Secondary | ICD-10-CM | POA: Diagnosis not present

## 2018-04-30 DIAGNOSIS — L720 Epidermal cyst: Secondary | ICD-10-CM | POA: Diagnosis not present

## 2018-04-30 DIAGNOSIS — C44529 Squamous cell carcinoma of skin of other part of trunk: Secondary | ICD-10-CM | POA: Diagnosis not present

## 2018-04-30 DIAGNOSIS — C44511 Basal cell carcinoma of skin of breast: Secondary | ICD-10-CM | POA: Diagnosis not present

## 2018-05-18 DIAGNOSIS — C44519 Basal cell carcinoma of skin of other part of trunk: Secondary | ICD-10-CM | POA: Diagnosis not present

## 2018-05-18 DIAGNOSIS — C44529 Squamous cell carcinoma of skin of other part of trunk: Secondary | ICD-10-CM | POA: Diagnosis not present

## 2018-05-28 DIAGNOSIS — C44519 Basal cell carcinoma of skin of other part of trunk: Secondary | ICD-10-CM | POA: Diagnosis not present

## 2018-09-15 DIAGNOSIS — M797 Fibromyalgia: Secondary | ICD-10-CM | POA: Diagnosis not present

## 2018-09-15 DIAGNOSIS — I10 Essential (primary) hypertension: Secondary | ICD-10-CM | POA: Diagnosis not present

## 2018-09-15 DIAGNOSIS — F410 Panic disorder [episodic paroxysmal anxiety] without agoraphobia: Secondary | ICD-10-CM | POA: Diagnosis not present

## 2018-09-15 DIAGNOSIS — F411 Generalized anxiety disorder: Secondary | ICD-10-CM | POA: Diagnosis not present

## 2019-03-04 ENCOUNTER — Other Ambulatory Visit: Payer: Self-pay | Admitting: Family Medicine

## 2019-03-04 DIAGNOSIS — Z1231 Encounter for screening mammogram for malignant neoplasm of breast: Secondary | ICD-10-CM

## 2019-07-24 IMAGING — DX DG CHEST 1V PORT
1 series · 1 of 1 positions shown · non-contrast
Comparison: Prior chest x-ray 10/26/2007

CLINICAL DATA: 61-year-old female status post intubation

EXAM:
PORTABLE CHEST 1 VIEW

[chest]
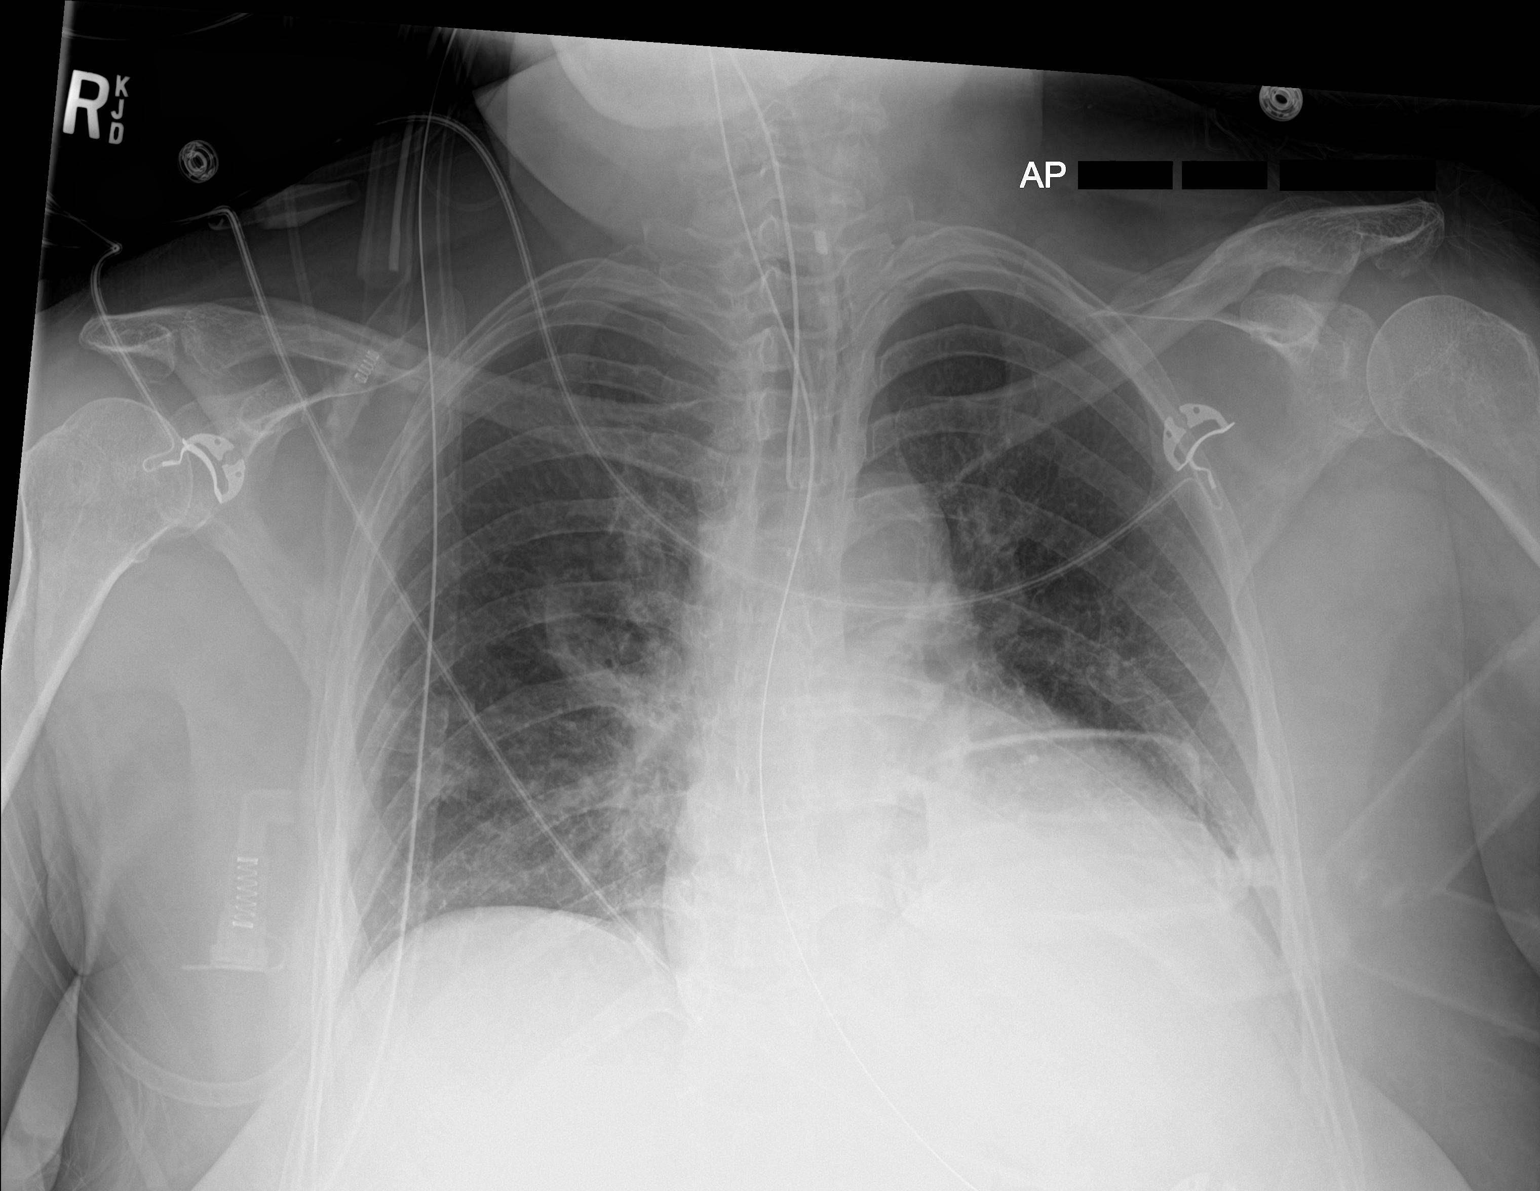

[1 of 1 positions shown; findings below may reference images not displayed]

FINDINGS: Patient is intubated. The tip the endotracheal tube is 3.3 cm above
the carina. A gastric tube is present. The tip lies off the field of
view, below the diaphragm and presumably within the stomach. Low
inspiratory volumes with bibasilar opacities favored to reflect
atelectasis. Background interstitial prominence and bronchitic
changes with some with her prior. No definite pulmonary edema,
pleural effusion or pneumothorax. No acute osseous abnormality.
IMPRESSION: 1. The endotracheal tube tip is 3.3 cm above the carina.
2. The tip of the gastric tube lies off the field of view, below the
diaphragm and presumably within the stomach.
3. Low inspiratory volumes with bibasilar and perihilar atelectasis.
4. Chronic bronchitic changes and interstitial prominence.

## 2019-07-24 IMAGING — DX DG ABDOMEN 1V
1 series · 1 of 1 positions shown · non-contrast
Comparison: CT scan dated 10/10/2015

CLINICAL DATA: OG tube placement.

EXAM:
ABDOMEN - 1 VIEW

[abdomen]
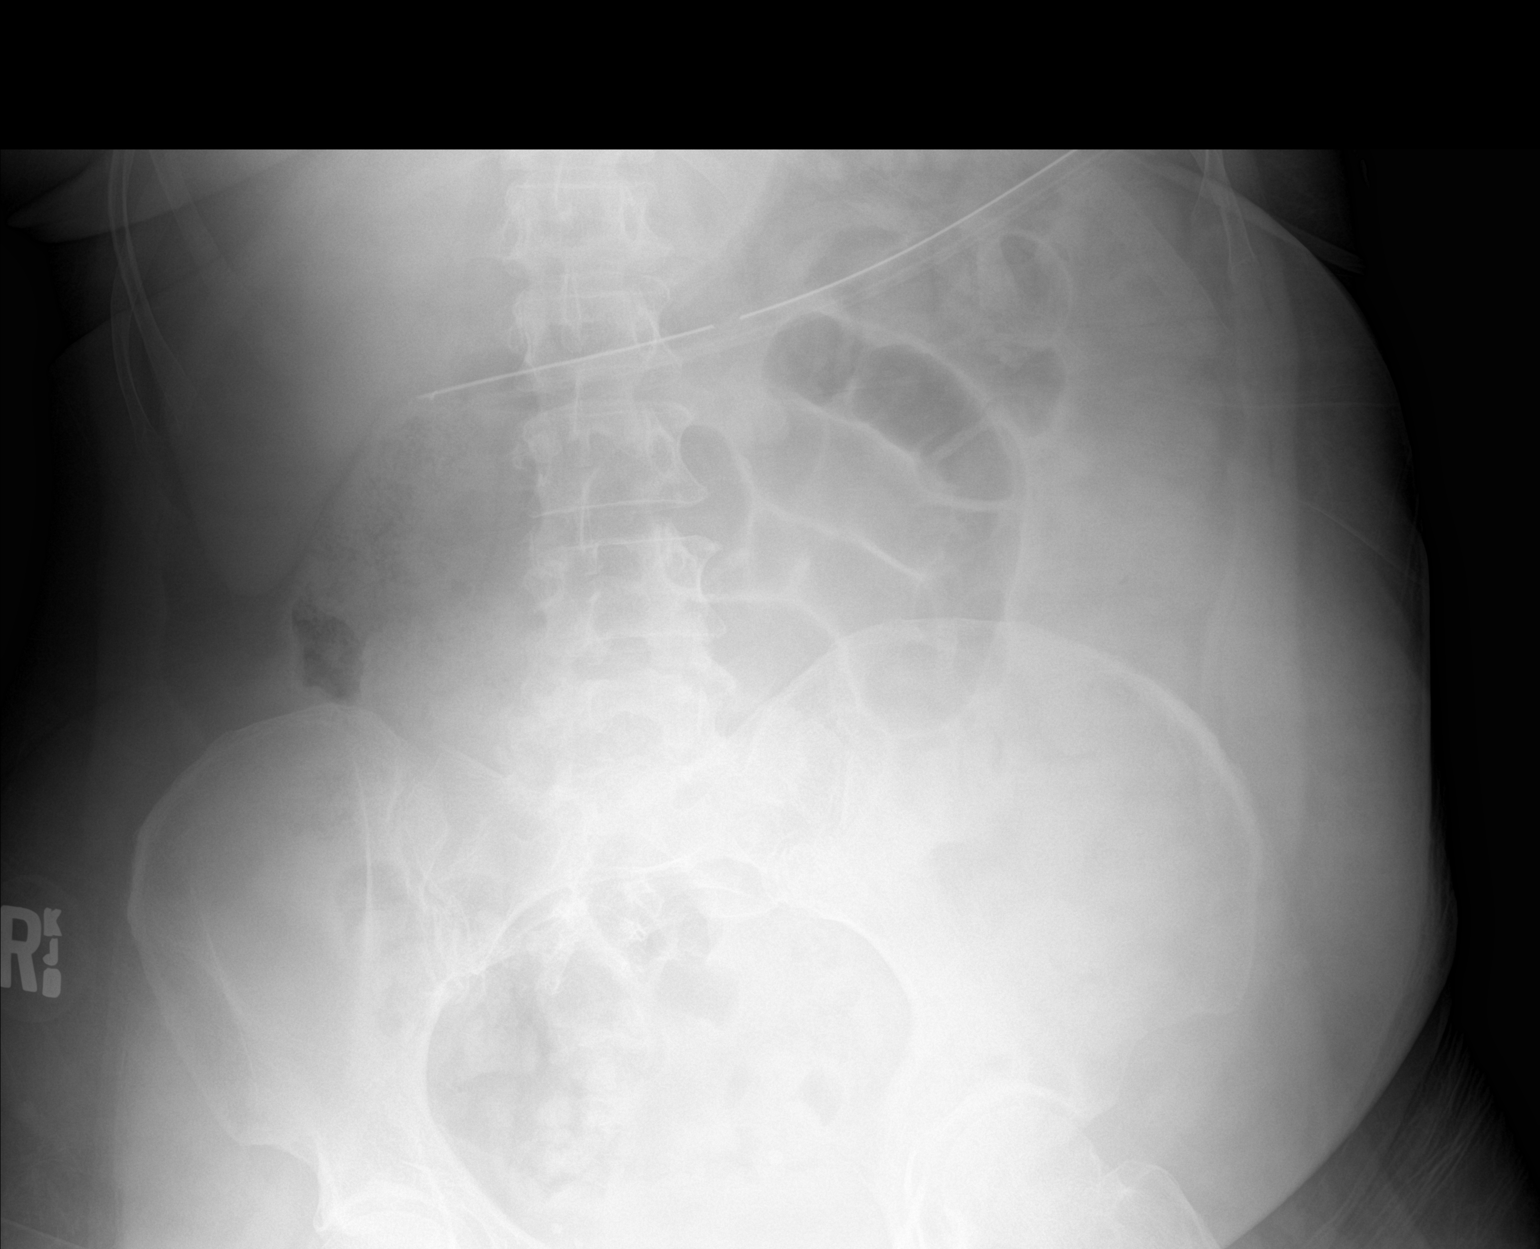

[1 of 1 positions shown; findings below may reference images not displayed]

FINDINGS: OG tube tip is in the distal stomach near the pylorus and duodenal
bulb. Bowel gas pattern is normal.
IMPRESSION: OG tube tip in the distal stomach.  Benign-appearing abdomen.

## 2019-07-25 IMAGING — DX DG CHEST 1V PORT
1 series · 1 of 1 positions shown · non-contrast
Comparison: 10/15/2016.

CLINICAL DATA: Respiratory failure.  Shortness of breath .

EXAM:
PORTABLE CHEST 1 VIEW

[chest ap]
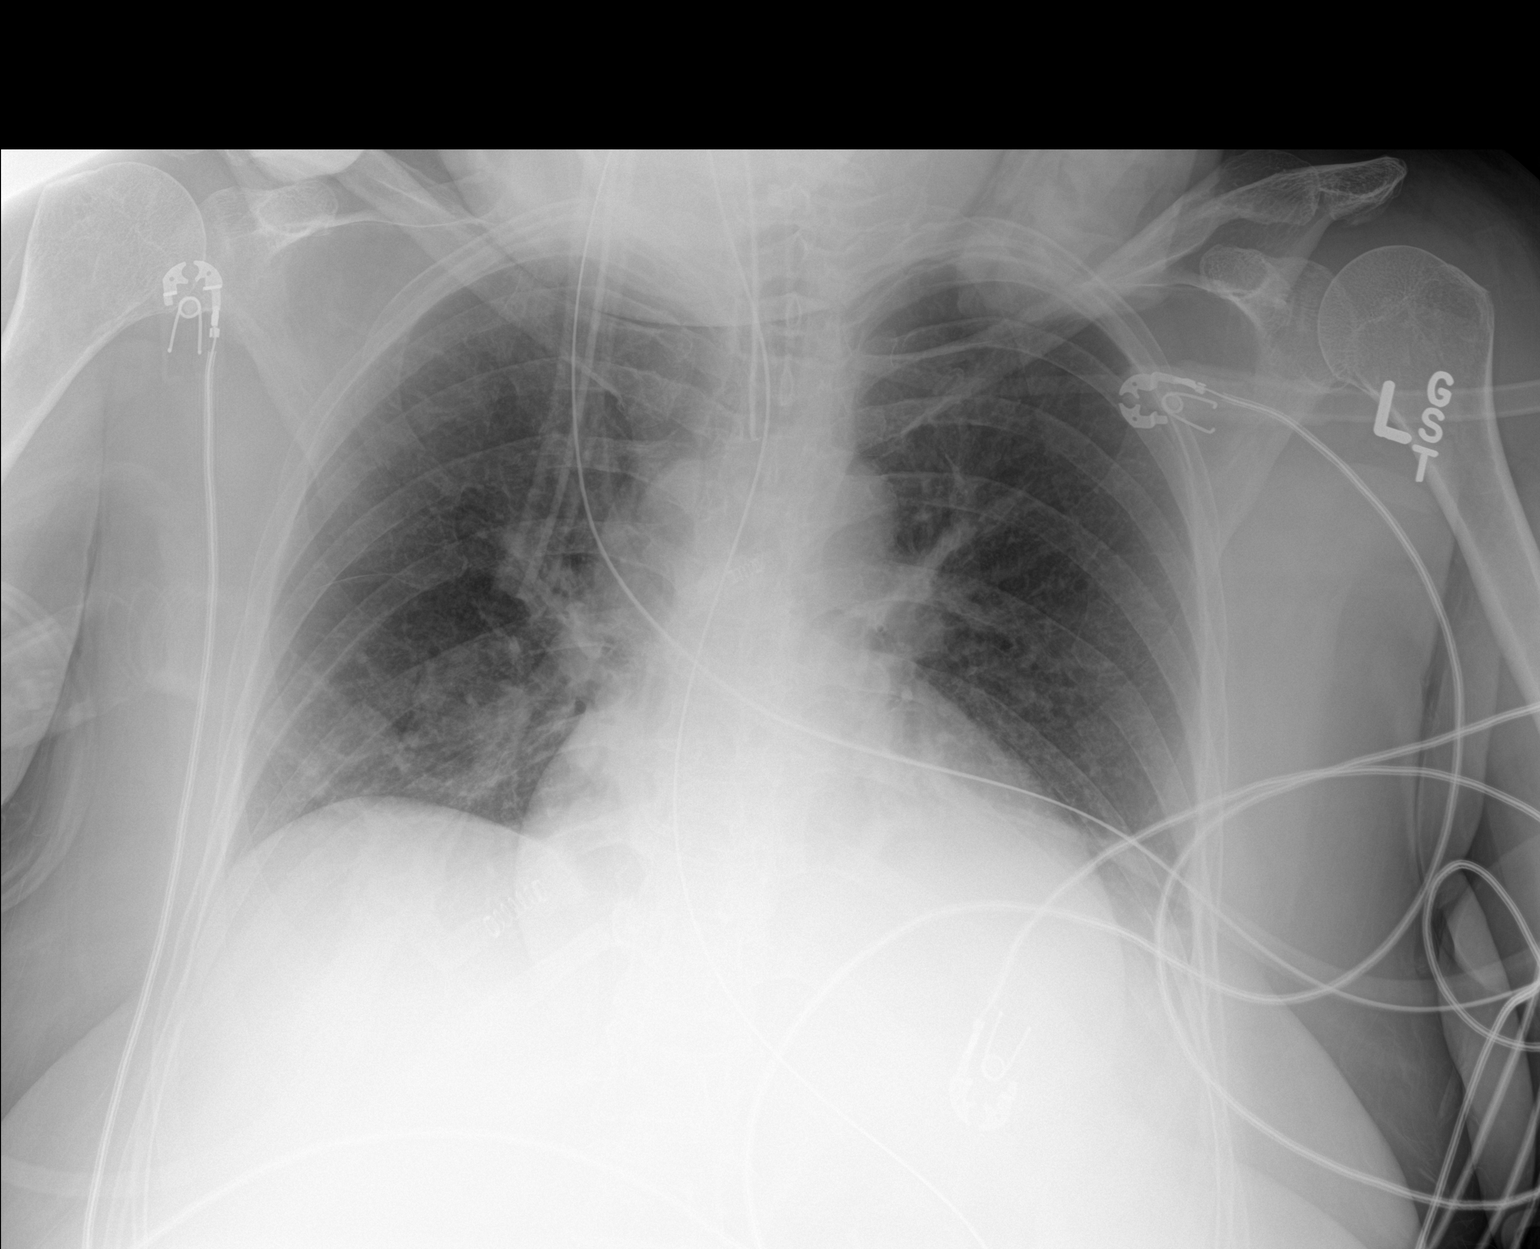

[1 of 1 positions shown; findings below may reference images not displayed]

FINDINGS: Endotracheal tube and NG tube in stable position. Heart size stable.
Improved aeration of lung bases with persistent mild bibasilar
atelectasis. Small left pleural effusion cannot be excluded. No
pneumothorax .
IMPRESSION: 1. Lines and tubes in stable position.

2. Improved aeration of lung bases with persistent mild bibasilar
atelectasis. Small left pleural effusion cannot be excluded.

## 2019-07-26 IMAGING — CR DG CHEST 1V PORT
1 series · 1 of 1 positions shown · non-contrast
Comparison: 10/16/2016.

CLINICAL DATA: Respiratory failure.

EXAM:
PORTABLE CHEST 1 VIEW

[AP]
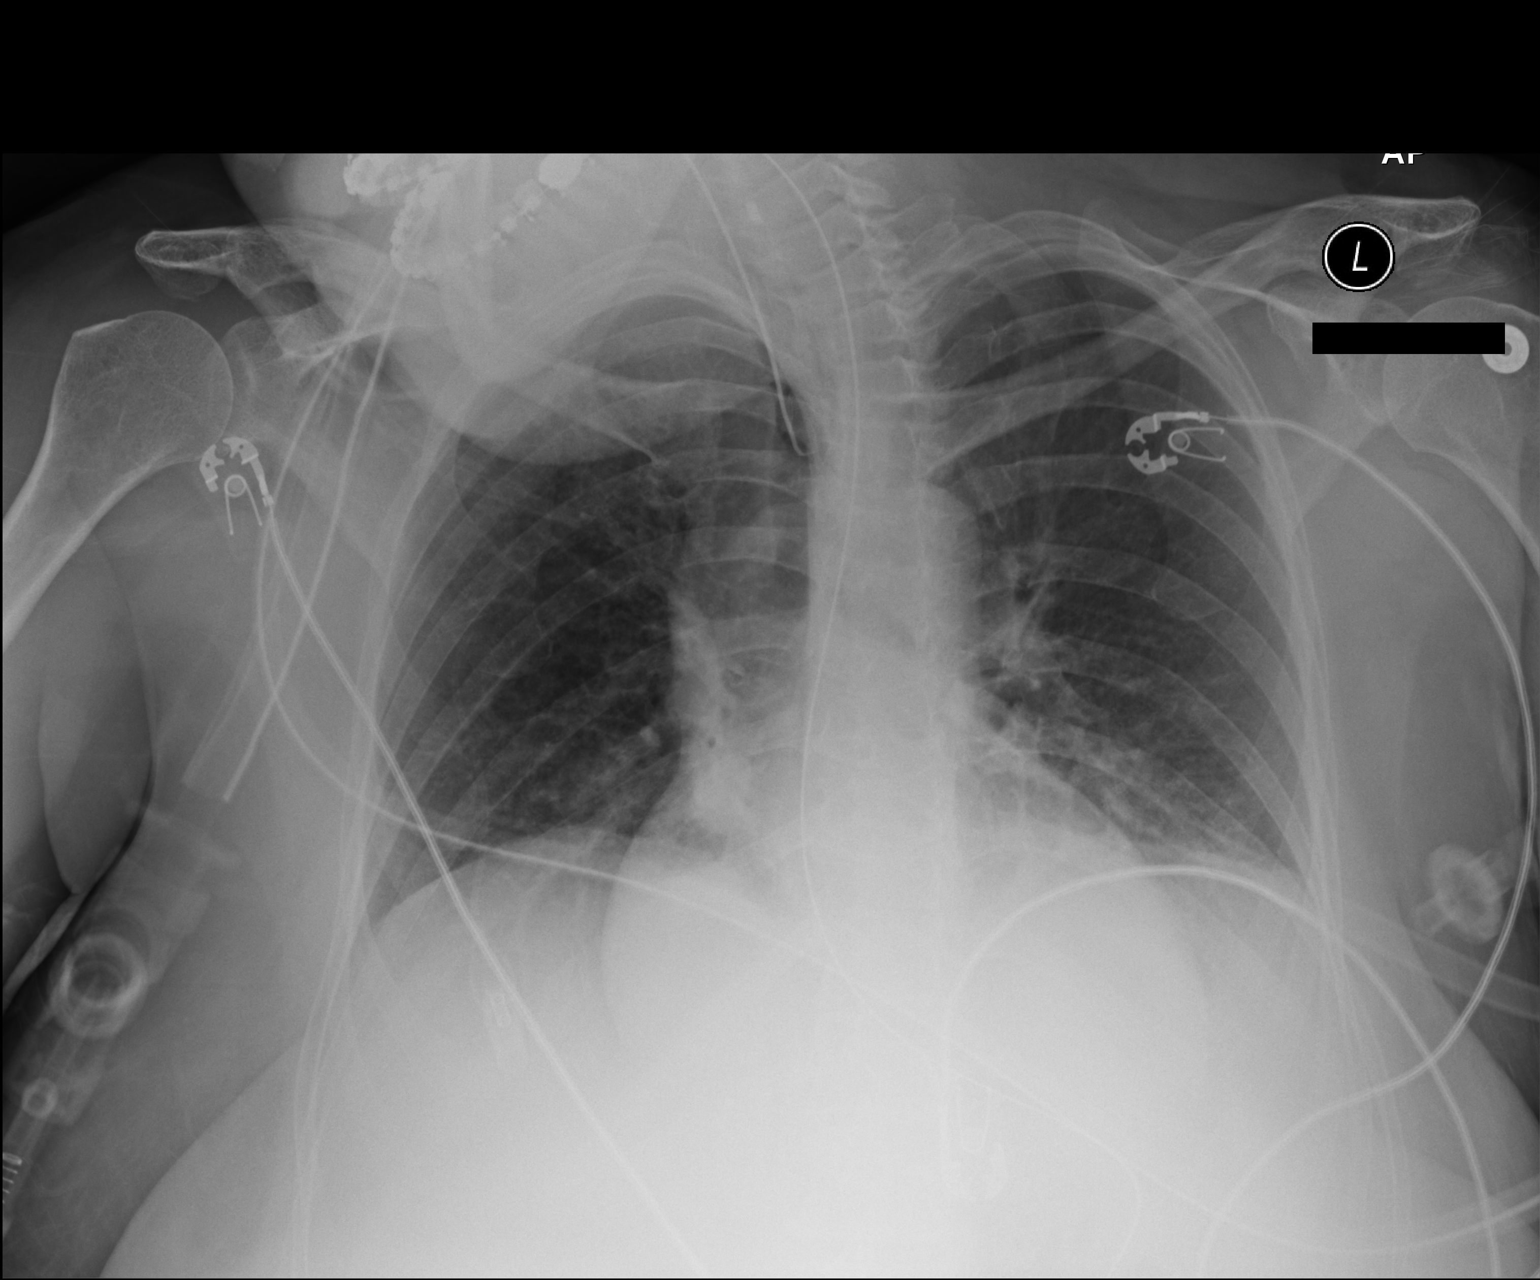

[1 of 1 positions shown; findings below may reference images not displayed]

FINDINGS: Endotracheal tube, NG tube in stable position. Heart size stable.
Persistent bibasilar atelectasis. Mild infiltrate in the left lung
base on today's exam No pleural effusion or pneumothorax.
IMPRESSION: 1. Lines and tubes in stable position.

2. Persistent bibasilar atelectasis. Mild left base infiltrate noted
on today's exam.

## 2019-07-29 IMAGING — DX DG CHEST 1V PORT
1 series · 1 of 1 positions shown · non-contrast
Comparison: 10/17/2016 and 10/26/2007.

CLINICAL DATA: 61-year-old female with shortness of breath.
Subsequent encounter.

EXAM:
PORTABLE CHEST 1 VIEW

[chest ap]
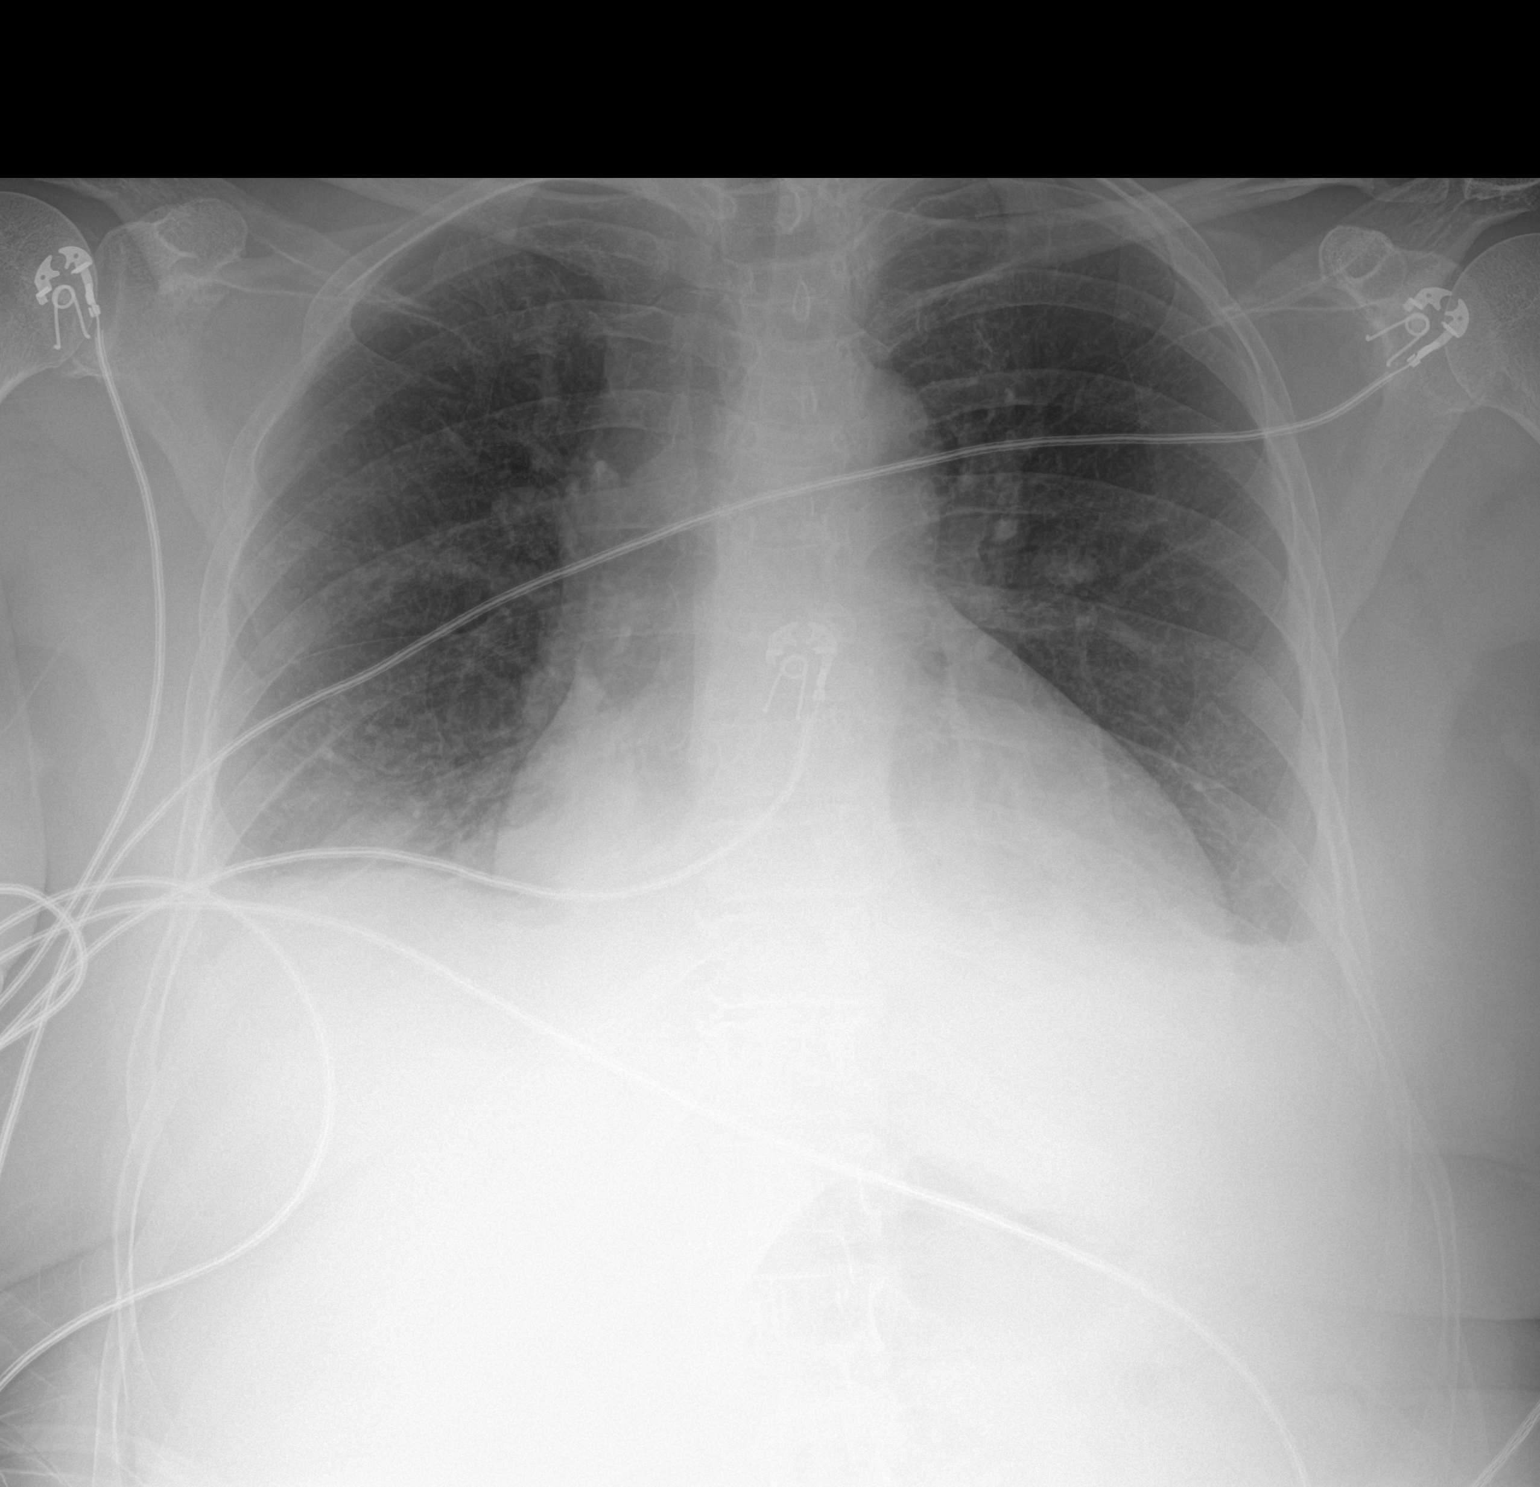

[1 of 1 positions shown; findings below may reference images not displayed]

FINDINGS: Cardiomegaly.

Minimal pulmonary vascular congestion with small bilateral pleural
effusions may represent residua of pulmonary edema.

Suspect subsegmental atelectasis lung bases. Infiltrate less likely
consideration.

Minimally tortuous aorta.

No acute osseous abnormality.
IMPRESSION: Cardiomegaly.

Minimal pulmonary vascular congestion with small bilateral pleural
effusions may represent residua of pulmonary edema.

Suspect subsegmental atelectasis lung bases. Basilar infiltrate less
likely consideration.

## 2020-05-10 ENCOUNTER — Other Ambulatory Visit: Payer: Self-pay | Admitting: Physician Assistant

## 2020-05-10 DIAGNOSIS — R1032 Left lower quadrant pain: Secondary | ICD-10-CM

## 2020-05-11 ENCOUNTER — Ambulatory Visit
Admission: RE | Admit: 2020-05-11 | Discharge: 2020-05-11 | Disposition: A | Discharge status: Home / Self Care | Payer: 59 | Source: Ambulatory Visit | Attending: Physician Assistant | Admitting: Physician Assistant

## 2020-05-11 DIAGNOSIS — R1032 Left lower quadrant pain: Secondary | ICD-10-CM

## 2020-05-11 MED ORDER — IOPAMIDOL (ISOVUE-300) INJECTION 61%
80.0000 mL | Freq: Once | INTRAVENOUS | Status: AC | PRN
  Administered 2020-05-11: 80 mL via INTRAVENOUS

## 2020-05-15 ENCOUNTER — Other Ambulatory Visit: Payer: Self-pay | Admitting: Family Medicine

## 2020-05-15 DIAGNOSIS — N838 Other noninflammatory disorders of ovary, fallopian tube and broad ligament: Secondary | ICD-10-CM

## 2020-05-30 ENCOUNTER — Ambulatory Visit
Admission: RE | Admit: 2020-05-30 | Discharge: 2020-05-30 | Disposition: A | Discharge status: Home / Self Care | Payer: 59 | Source: Ambulatory Visit | Attending: Family Medicine | Admitting: Family Medicine

## 2020-05-30 DIAGNOSIS — N838 Other noninflammatory disorders of ovary, fallopian tube and broad ligament: Secondary | ICD-10-CM

## 2020-06-20 ENCOUNTER — Telehealth: Payer: Self-pay | Admitting: *Deleted

## 2020-06-20 ENCOUNTER — Encounter: Payer: Self-pay | Admitting: Gynecologic Oncology

## 2020-06-20 NOTE — Telephone Encounter (Signed)
Spoke with the patient and scheduled a new patient appt for 4/1 at 10:30 with Dr Denman George. Patient given an arrival time of 10 am. Patient given the address and phone number for the clinic. Patient also given the policy for mask and visitors

## 2020-06-22 ENCOUNTER — Encounter (HOSPITAL_COMMUNITY): Payer: Self-pay | Admitting: Gynecologic Oncology

## 2020-06-22 ENCOUNTER — Inpatient Hospital Stay: Payer: 59

## 2020-06-22 ENCOUNTER — Telehealth: Payer: Self-pay

## 2020-06-22 ENCOUNTER — Other Ambulatory Visit: Payer: Self-pay

## 2020-06-22 ENCOUNTER — Encounter: Payer: Self-pay | Admitting: Gynecologic Oncology

## 2020-06-22 ENCOUNTER — Other Ambulatory Visit: Payer: Self-pay | Admitting: Gynecologic Oncology

## 2020-06-22 ENCOUNTER — Inpatient Hospital Stay: Payer: 59 | Attending: Gynecologic Oncology | Admitting: Gynecologic Oncology

## 2020-06-22 VITALS — BP 128/80 | HR 58 | Temp 97.3°F | Resp 16 | Ht 65.0 in | Wt 189.4 lb

## 2020-06-22 DIAGNOSIS — N9489 Other specified conditions associated with female genital organs and menstrual cycle: Secondary | ICD-10-CM | POA: Diagnosis not present

## 2020-06-22 DIAGNOSIS — N189 Chronic kidney disease, unspecified: Secondary | ICD-10-CM | POA: Diagnosis not present

## 2020-06-22 DIAGNOSIS — F419 Anxiety disorder, unspecified: Secondary | ICD-10-CM | POA: Diagnosis not present

## 2020-06-22 DIAGNOSIS — S70362A Insect bite (nonvenomous), left thigh, initial encounter: Secondary | ICD-10-CM | POA: Insufficient documentation

## 2020-06-22 DIAGNOSIS — I129 Hypertensive chronic kidney disease with stage 1 through stage 4 chronic kidney disease, or unspecified chronic kidney disease: Secondary | ICD-10-CM | POA: Insufficient documentation

## 2020-06-22 DIAGNOSIS — W57XXXA Bitten or stung by nonvenomous insect and other nonvenomous arthropods, initial encounter: Secondary | ICD-10-CM | POA: Diagnosis not present

## 2020-06-22 DIAGNOSIS — Z79899 Other long term (current) drug therapy: Secondary | ICD-10-CM | POA: Diagnosis not present

## 2020-06-22 DIAGNOSIS — M797 Fibromyalgia: Secondary | ICD-10-CM | POA: Diagnosis not present

## 2020-06-22 DIAGNOSIS — N83201 Unspecified ovarian cyst, right side: Secondary | ICD-10-CM | POA: Diagnosis not present

## 2020-06-22 DIAGNOSIS — M1990 Unspecified osteoarthritis, unspecified site: Secondary | ICD-10-CM | POA: Diagnosis not present

## 2020-06-22 DIAGNOSIS — E7800 Pure hypercholesterolemia, unspecified: Secondary | ICD-10-CM | POA: Diagnosis not present

## 2020-06-22 LAB — COMPREHENSIVE METABOLIC PANEL
ALT: 17 U/L (ref 0–44)
AST: 15 U/L (ref 15–41)
Albumin: 4 g/dL (ref 3.5–5.0)
Alkaline Phosphatase: 64 U/L (ref 38–126)
Anion gap: 10 (ref 5–15)
BUN: 16 mg/dL (ref 8–23)
CO2: 27 mmol/L (ref 22–32)
Calcium: 9.3 mg/dL (ref 8.9–10.3)
Chloride: 103 mmol/L (ref 98–111)
Creatinine, Ser: 1.32 mg/dL — ABNORMAL HIGH (ref 0.44–1.00)
GFR, Estimated: 45 mL/min — ABNORMAL LOW (ref >60)
Glucose, Bld: 101 mg/dL — ABNORMAL HIGH (ref 70–99)
Potassium: 4.3 mmol/L (ref 3.5–5.1)
Sodium: 140 mmol/L (ref 135–145)
Total Bilirubin: 0.5 mg/dL (ref 0.3–1.2)
Total Protein: 6.7 g/dL (ref 6.5–8.1)

## 2020-06-22 MED ORDER — DOXYCYCLINE HYCLATE 100 MG PO TABS: 100.0000 mg | ORAL_TABLET | Freq: Two times a day (BID) | ORAL | 1 refills | Status: AC

## 2020-06-22 MED ORDER — TRAMADOL HCL 50 MG PO TABS
50.0000 mg | ORAL_TABLET | Freq: Four times a day (QID) | ORAL | 0 refills | Status: DC | PRN
Start: 2020-06-22 — End: 2020-06-27

## 2020-06-22 MED ORDER — SENNOSIDES-DOCUSATE SODIUM 8.6-50 MG PO TABS: 2.0000 | ORAL_TABLET | Freq: Every day | ORAL | 0 refills | Status: DC

## 2020-06-22 NOTE — H&P (View-Only) (Signed)
Consult Note: Gyn-Onc  Consult was requested by Dr. Christophe Louis for the evaluation of Anna [name] 65 y.o. female  CC:  Chief Complaint  Patient presents with  . Adnexal mass  . right ovarian cyst    Assessment/Plan:  Anna [name]  is a 65 y.o.  year old with a 5cm cystic right adnexal mass.   I reviewed the CT images with the patient.  She reportedly has a normal Ca1 25, though we will follow-up this result with her ordering gynecologist office.  Based on my impression from the CT images, I have been extremely low suspicion that this is a malignancy.  I do not believe it is the cause of her symptoms of malaise, decreased appetite, and left lower quadrant discomfort.  I explained that she has 2 options to move forward.  The first most conservative option would be close surveillance with serial imaging over 5-year span.  If there is substantial change or complexity to the cystic mass demonstrated during that time.  She could proceed with surgical intervention.  Alternatively she could proceed with surgical intervention now with robotic assisted bilateral salpingo-oophorectomy, possible staging.  I explained that for her surgery might be complicated by adhesive disease due to history of diverticulitis and apparent thickening of the colonic wall on imaging.  I explained that adhesions between the bowel and the ovaries could result in injury to the underlying bowel.  This could result in serious complications and reoperation.  She has a colonoscopy scheduled for April 25.  I explained that if findings during the colonoscopy suggest that she needs surgical intervention, this may result in her having a second surgery.  Therefore I offered her delayed surgery until after her colonoscopy.  The patient elected to proceed with surgery for ovarian cyst prior to colonoscopy understanding that she may require a second surgery in the near future.  We will check renal function today given her  history of CKD.  If this is greater than 1.5, she will need to have further work-up by her internal medicine doctors prior to proceeding with surgical intervention.  She has some inflammation around the left thigh tick bite.  I removed the head of the tick today in the office there was no apparent cellulitis.  I did prescribe her empirically doxycycline for 1 week.  I explained to the patient that I do not feel that her ovarian cyst was the cause of her symptoms and therefore these were likely to persist after surgery.  Surgery has been scheduled for June 27, 2020 comprised of a robotic assisted bilateral salpingo-oophorectomy, possible staging.   HPI: Anna [name] is a 65 year old P2 who was seen in consultation at the request of Dr Landry Mellow for evaluation of a right adnexal mass.  The patient was reporting left lower quadrant discomfort, malaise, decreased appetite in February 2022.  This prompted her general practitioner to order a CT scan of the abdomen and pelvis which was performed on 05/11/2020.  CT scan showed segmental thickening of the colon through the sigmoid colon.  There was scattered colonic diverticuli.  There was no apparent abscess or fluid.  The findings were equivocal for colitis.  There is a long segment of thickening away from areas of diverticular change.  There is an incidental finding of a 3.6 x 3.3 cm cystic lesion in the right ovary with possible mural nodularity in the posterior margin.  This was followed up with a transvaginal ultrasound scan of the  pelvis performed on May 30, 2020.  This revealed a surgically absent uterus.  The right ovary contained a cystic mass measuring 5.8 x 4.6 x 3.4 cm with multiple septations and a nodular focus that appeared to contain echogenic foci.  There was no free fluid.  The left ovary was not visualized.  Based on the findings of her CT scan she had a colonoscopy scheduled for July 16, 2020.  The patient saw Dr. Landry Mellow in the office  on 06/19/2020 and a Ca1 25 was drawn at that time with her the results of this analysis were not available for me at the time of my consultation.  The patient reported she had been called and notified that they were normal.  Incidentally she reported a tick bite in March 2022 this occurred on her left upper thigh.  She removed the tick but felt there was still a small area remaining and she had some erythema and rash surrounding the site.  Her medical history is most significant for obesity with a BMI of 31.5 kg meters squared.  She has chronic kidney disease and hypertension and hypercholesterolemia.  Her surgical history is most significant for a vaginal hysterectomy for fibroids and menorrhagia that was performed in 0623 and was complicated by left ureteral injury treated with percutaneous nephrostomy stenting.  She subsequently had oral surgery in 7628 which was complicated by significant perioperative bleeding.  Of note she does not not report as history of bleeding abnormalities, or history of abnormal bleeding after hysterectomy and was not felt to have a bleeding diathesis.  Her gynecologic history is remarkable for a vaginal hysterectomy for benign disease in 2003.  Her family cancer history is unremarkable.  Current Meds:  Outpatient Encounter Medications as of 06/22/2020  Medication Sig  . ALPRAZolam (XANAX) 0.5 MG tablet Take 0.5 mg by mouth daily as needed for anxiety.  Marland Kitchen atenolol (TENORMIN) 50 MG tablet Take 50 mg by mouth 2 (two) times daily.  Marland Kitchen doxycycline (VIBRA-TABS) 100 MG tablet Take 1 tablet (100 mg total) by mouth 2 (two) times daily.  Marland Kitchen FLUoxetine (PROZAC) 10 MG capsule Take 10 mg by mouth daily.  Marland Kitchen lisinopril-hydrochlorothiazide (PRINZIDE,ZESTORETIC) 20-12.5 MG tablet Take 1 tablet by mouth daily.   . metoprolol tartrate (LOPRESSOR) 25 MG tablet Take 1 tablet (25 mg total) by mouth 2 (two) times daily.  . nortriptyline (PAMELOR) 10 MG capsule Take 10 mg by mouth at bedtime.   . senna-docusate (SENOKOT-S) 8.6-50 MG tablet Take 2 tablets by mouth at bedtime. For AFTER surgery, do not take if having diarrhea  . traMADol (ULTRAM) 50 MG tablet Take 1 tablet (50 mg total) by mouth every 6 (six) hours as needed for severe pain. For AFTER surgery only, do not take and drive  . triamterene-hydrochlorothiazide (DYAZIDE) 37.5-25 MG capsule Take 1 capsule by mouth daily.   No facility-administered encounter medications on file as of 06/22/2020.    Allergy:  Allergies  Allergen Reactions  . Cymbalta [Duloxetine Hcl]     Anxiety   . Zyrtec [Cetirizine] Other (See Comments)    Anxiety     Social Hx:   Social History   Socioeconomic History  . Marital status: Divorced    Spouse name: Not on file  . Number of children: Not on file  . Years of education: Not on file  . Highest education level: Not on file  Occupational History  . Not on file  Tobacco Use  . Smoking status: Never Smoker  . Smokeless  tobacco: Never Used  Vaping Use  . Vaping Use: Not on file  Substance and Sexual Activity  . Alcohol use: Yes    Alcohol/week: 2.0 standard drinks    Types: 2 Glasses of wine per week  . Drug use: No  . Sexual activity: Never    Birth control/protection: Surgical  Other Topics Concern  . Not on file  Social History Narrative  . Not on file   Social Determinants of Health   Financial Resource Strain: Not on file  Food Insecurity: Not on file  Transportation Needs: Not on file  Physical Activity: Not on file  Stress: Not on file  Social Connections: Not on file  Intimate Partner Violence: Not on file    Past Surgical Hx:  Past Surgical History:  Procedure Laterality Date  . ABDOMINAL HYSTERECTOMY     uterus only  . MAXILLARY LE FORTE II OSTEOTOMY N/A 10/15/2016   Procedure: LEFORTE 1 MAXILLARY OSTEOTOMY WITH ADVANCEMENT AND GRAFT;  Surgeon: Jannette Fogo, DDS;  Location: [place];  Service: Oral Surgery;  Laterality: N/A;    Past  Medical Hx:  Past Medical History:  Diagnosis Date  . Anxiety   . Arthritis   . Diverticulitis   . Fibromyalgia   . Gluten intolerance   . Hypertension   . Right ovarian cyst     Past Gynecological History:  See HPI No LMP recorded. Patient has had a hysterectomy.  Family Hx:  Family History  Problem Relation Age of Onset  . Hypertension Mother   . Heart attack Mother   . Cancer Father        lung ca  . Diabetes Father     Review of Systems:  Constitutional  Feels well,   ENT Normal appearing ears and nares bilaterally Skin/Breast  No rash, sores, jaundice, itching, dryness Cardiovascular  No chest pain, shortness of breath, or edema  Pulmonary  No cough or wheeze.  Gastro Intestinal  + left lower quadrant discomfort Genito Urinary  No frequency, urgency, dysuria, see HPI Musculo Skeletal  No myalgia, arthralgia, joint swelling or pain  Neurologic  No weakness, numbness, change in gait,  Psychology  No depression, anxiety, insomnia.   Vitals:  Blood pressure 128/80, pulse (!) 58, temperature (!) 97.3 F (36.3 C), temperature source Tympanic, resp. rate 16, height 5\' 5"  (1.651 m), weight 189 lb 6.4 oz (85.9 kg), SpO2 100 %.  Physical Exam: WD in NAD Neck  Supple NROM, without any enlargements.  Lymph Node Survey No cervical supraclavicular or inguinal adenopathy Cardiovascular  Well perfused peripheries Lungs  No increased WOB Skin  No rash/lesions/breakdown  Psychiatry  Alert and oriented to person, place, and time  Abdomen  Normoactive bowel sounds, abdomen soft, non-tender and obese without evidence of hernia.  Back No CVA tenderness Genito Urinary  Vulva/vagina: Normal external female genitalia.  No lesions. No discharge or bleeding.  Bladder/urethra:  No lesions or masses, well supported bladder  Vagina: normal  Cervix and uterus surgically absent  Adnexa: no palpable masses. Rectal  Good tone, no masses no cul de sac nodularity.   Extremities  Left upper thigh with non blanching erythema over a 3x3cm area of skin with central tick bite site with head of tick remaining embedded.  With patient's permission I prepped the overlying skin with Betadine and used sterile forceps to extract the remaining take Bentyl there was no visible foreign body material present.  60 minutes of total time was spent for this  patient encounter, including preparation, face-to-face counseling with the patient and coordination of care, review of imaging (results and images), communication with the referring provider and documentation of the encounter.   Thereasa Solo, MD  06/22/2020, 11:54 AM

## 2020-06-22 NOTE — Patient Instructions (Addendum)
Dr. Denman George has prescribed doxycycline to take twice a day for the next seven days for the recent tick bite. Avoid sun exposure with this medication.  Preparing for your Surgery  Plan for surgery on either on April 6 or July 03, 2020 with Dr. Everitt Amber at [facility] vs [facility]. You will be scheduled for a robotic assisted laparoscopic bilateral salpingo-oophorectomy, possible staging if a cancer is identified.  We will check your kidney function today and contact you with the results. This information will let us know if you need further workup for your kidneys prior to surgery.  We will also begin prior authorization for your surgery with your insurance. If this is approved quickly, we can proceed with surgery on April 6. If still not approved by that date, we will move your surgery to April 12.    Pre-operative Testing -You will receive a phone call from presurgical testing at [facility] to arrange for a pre-operative appointment, labs, and COVID test. The COVID test normally happens 3 days prior to the surgery and they ask that you self quarantine after the test up until surgery to decrease chance of exposure.  -Bring your insurance card, copy of an advanced directive if applicable, medication list  -At that visit, you will be asked to sign a consent for a possible blood transfusion in case a transfusion becomes necessary during surgery.  The need for a blood transfusion is rare but having consent is a necessary part of your care.     -You should not be taking blood thinners or aspirin at least ten days prior to surgery unless instructed by your surgeon.  -Do not take supplements such as fish oil (omega 3), red yeast rice, turmeric before your surgery. You want to avoid medications with aspirin in them including headache powders such as BC or Goody's), Excedrin migraine.  Day Before Surgery at [place] will be asked to take in a light diet the day  before surgery. You will be advised you can have clear liquids up until 3 hours before your surgery.    Eat a light diet the day before surgery.  Examples including soups, broths, toast, yogurt, mashed potatoes.  AVOID GAS PRODUCING FOODS. Things to avoid include carbonated beverages (fizzy beverages, sodas), raw fruits and raw vegetables (uncooked), or beans.   If your bowels are filled with gas, your surgeon will have difficulty visualizing your pelvic organs which increases your surgical risks.  Your role in recovery Your role is to become active as soon as directed by your doctor, while still giving yourself time to heal.  Rest when you feel tired. You will be asked to do the following in order to speed your recovery:  - Cough and breathe deeply. This helps to clear and expand your lungs and can prevent pneumonia after surgery.  - [place]. Do mild physical activity. Walking or moving your legs help your circulation and body functions return to normal. Do not try to get up or walk alone the first time after surgery.   -If you develop swelling on one leg or the other, pain in the back of your leg, redness/warmth in one of your legs, please call the office or go to the Emergency Room to have a doppler to rule out a blood clot. For shortness of breath, chest pain-seek care in the Emergency Room as soon as possible. - Actively manage your pain. Managing your pain lets  you move in comfort. We will ask you to rate your pain on a scale of zero to 10. It is your responsibility to tell your doctor or nurse where and how much you hurt so your pain can be treated.  Special Considerations -If you are diabetic, you may be placed on insulin after surgery to have closer control over your blood sugars to promote healing and recovery.  This does not mean that you will be discharged on insulin.  If applicable, your oral antidiabetics will be resumed when you are tolerating a solid  diet.  -Your final pathology results from surgery should be available around one week after surgery and the results will be relayed to you when available.  -Dr. Lahoma Crocker is the surgeon that assists your GYN Oncologist with surgery.  If you end up staying the night, the next day after your surgery you will either see Dr. Denman George, Dr. Berline Lopes, or Dr. Lahoma Crocker.  -FMLA forms can be faxed to [phone] and please allow 5-7 business days for completion.  Pain Management After Surgery -You have been prescribed your pain medication and bowel regimen medications before surgery so that you can have these available when you are discharged from the hospital. The pain medication is for use ONLY AFTER surgery and a new prescription will not be given.   -Make sure that you have Tylenol at home to use on a regular basis after surgery for pain control.   -Review the attached handout on narcotic use and their risks and side effects.   Bowel Regimen -You have been prescribed Sennakot-S to take nightly to prevent constipation especially if you are taking the narcotic pain medication intermittently.  It is important to prevent constipation and drink adequate amounts of liquids. You can stop taking this medication when you are not taking pain medication and you are back on your normal bowel routine.  Risks of Surgery Risks of surgery are low but include bleeding, infection, damage to surrounding structures, re-operation, blood clots, and very rarely death.   Blood Transfusion Information (For the consent to be signed before surgery)  We will be checking your blood type before surgery so in case of emergencies, we will know what type of blood you would need.                                            WHAT IS A BLOOD TRANSFUSION?  A transfusion is the replacement of blood or some of its parts. Blood is made up of multiple cells which provide different functions.  Red blood cells carry oxygen  and are used for blood loss replacement.  White blood cells fight against infection.  Platelets control bleeding.  Plasma helps clot blood.  Other blood products are available for specialized needs, such as hemophilia or other clotting disorders. BEFORE THE TRANSFUSION  Who gives blood for transfusions?   You may be able to donate blood to be used at a later date on yourself (autologous donation).  Relatives can be asked to donate blood. This is generally not any safer than if you have received blood from a stranger. The same precautions are taken to ensure safety when a relative's blood is donated.  Healthy volunteers who are fully evaluated to make sure their blood is safe. This is blood bank blood. Transfusion therapy is the safest it has ever been in the  practice of medicine. Before blood is taken from a donor, a complete history is taken to make sure that person has no history of diseases nor engages in risky social behavior (examples are intravenous drug use or sexual activity with multiple partners). The donor's travel history is screened to minimize risk of transmitting infections, such as malaria. The donated blood is tested for signs of infectious diseases, such as HIV and hepatitis. The blood is then tested to be sure it is compatible with you in order to minimize the chance of a transfusion reaction. If you or a relative donates blood, this is often done in anticipation of surgery and is not appropriate for emergency situations. It takes many days to process the donated blood. RISKS AND COMPLICATIONS Although transfusion therapy is very safe and saves many lives, the main dangers of transfusion include:   Getting an infectious disease.  Developing a transfusion reaction. This is an allergic reaction to something in the blood you were given. Every precaution is taken to prevent this. The decision to have a blood transfusion has been considered carefully by your caregiver before  blood is given. Blood is not given unless the benefits outweigh the risks.  AFTER SURGERY INSTRUCTIONS  Return to work: 4 weeks if applicable  Activity: 1. Be up and out of the bed during the day.  Take a nap if needed.  You may walk up steps but be careful and use the hand rail.  Stair climbing will tire you more than you think, you may need to stop part way and rest.   2. No lifting or straining for 6 weeks over 10 pounds. No pushing, pulling, straining for 6 weeks.  3. No driving for 1 week(s).  Do not drive if you are taking narcotic pain medicine and make sure that your reaction time has returned.   4. You can shower as soon as the next day after surgery. Shower daily.  Use your regular soap and water (not directly on the incision) and pat your incision(s) dry afterwards; don't rub.  No tub baths or submerging your body in water until cleared by your surgeon. If you have the soap that was given to you by pre-surgical testing that was used before surgery, you do not need to use it afterwards because this can irritate your incisions.   5. No sexual activity and nothing in the vagina for 4 weeks.  6. You may experience a small amount of clear drainage from your incisions, which is normal.  If the drainage persists, increases, or changes color please call the office.  7. Do not use creams, lotions, or ointments such as neosporin on your incisions after surgery until advised by your surgeon because they can cause removal of the dermabond glue on your incisions.    8. Take Tylenol first for pain and only use narcotic pain medication for severe pain not relieved by the Tylenol.  Monitor your Tylenol intake to a max of 4,000 mg in a 24 hour period.   Diet: 1. Low sodium Heart Healthy Diet is recommended but you are cleared to resume your normal (before surgery) diet after your procedure.  2. It is safe to use a laxative, such as Miralax or Colace, if you have difficulty moving your bowels. You  have been prescribed Sennakot at bedtime every evening to keep bowel movements regular and to prevent constipation.    Wound Care: 1. Keep clean and dry.  Shower daily.  Reasons to call the Doctor:  Fever - Oral temperature greater than 100.4 degrees Fahrenheit  Foul-smelling vaginal discharge  Difficulty urinating  Nausea and vomiting  Increased pain at the site of the incision that is unrelieved with pain medicine.  Difficulty breathing with or without chest pain  New calf pain especially if only on one side  Sudden, continuing increased vaginal bleeding with or without clots.   Contacts: For questions or concerns you should contact:  Dr. Everitt Amber at [phone]  Joylene John, NP at [phone]  After Hours: call [phone] and have the GYN Oncologist paged/contacted (after 5 pm or on the weekends).  Messages sent via mychart are for non-urgent matters and are not responded to after hours so for urgent needs, please call the after hours number.  Doxycycline tablets or capsules What is this medicine? DOXYCYCLINE (dox i SYE kleen) is a tetracycline antibiotic. It kills certain bacteria or stops their growth. It is used to treat many kinds of infections, like dental, skin, respiratory, and urinary tract infections. It also treats acne, Lyme disease, malaria, and certain sexually transmitted infections. This medicine may be used for other purposes; ask your health care provider or pharmacist if you have questions. COMMON BRAND NAME(S): Acticlate, Adoxa, Adoxa CK, Adoxa Pak, Adoxa TT, Alodox, Avidoxy, Doxal, LYMEPAK, Mondoxyne NL, Monodox, Morgidox 1x, Morgidox 1x Kit, Morgidox 2x, Morgidox 2x Kit, NutriDox, Ocudox, [place], [place], Mondovi, Vibra-Tabs, Vibramycin What should I tell my health care provider before I take this medicine? They need to know if you have any of these conditions:  liver disease  long exposure to sunlight like working outdoors  stomach  problems like colitis  an unusual or allergic reaction to doxycycline, tetracycline antibiotics, other medicines, foods, dyes, or preservatives  pregnant or trying to get pregnant  breast-feeding How should I use this medicine? Take this medicine by mouth with a full glass of water. Follow the directions on the prescription label. It is best to take this medicine without food, but if it upsets your stomach take it with food. Take your medicine at regular intervals. Do not take your medicine more often than directed. Take all of your medicine as directed even if you think you are better. Do not skip doses or stop your medicine early. Talk to your pediatrician regarding the use of this medicine in children. While this drug may be prescribed for selected conditions, precautions do apply. Overdosage: If you think you have taken too much of this medicine contact a poison control center or emergency room at once. NOTE: This medicine is only for you. Do not share this medicine with others. What if I miss a dose? If you miss a dose, take it as soon as you can. If it is almost time for your next dose, take only that dose. Do not take double or extra doses. What may interact with this medicine?  antacids  barbiturates  birth control pills  bismuth subsalicylate  carbamazepine  methoxyflurane  other antibiotics  phenytoin  vitamins that contain iron  warfarin This list may not describe all possible interactions. Give your health care provider a list of all the medicines, herbs, non-prescription drugs, or dietary supplements you use. Also tell them if you smoke, drink alcohol, or use illegal drugs. Some items may interact with your medicine. What should I watch for while using this medicine? Tell your doctor or health care professional if your symptoms do not improve. Do not treat diarrhea with over the counter products. Contact your doctor if you have diarrhea  that lasts more than 2 days  or if it is severe and watery. Do not take this medicine just before going to bed. It may not dissolve properly when you lay down and can cause pain in your throat. Drink plenty of fluids while taking this medicine to also help reduce irritation in your throat. This medicine can make you more sensitive to the sun. Keep out of the sun. If you cannot avoid being in the sun, wear protective clothing and use sunscreen. Do not use sun lamps or tanning beds/booths. Birth control pills may not work properly while you are taking this medicine. Talk to your doctor about using an extra method of birth control. If you are being treated for a sexually transmitted infection, avoid sexual contact until you have finished your treatment. Your sexual partner may also need treatment. Avoid antacids, aluminum, calcium, magnesium, and iron products for 4 hours before and 2 hours after taking a dose of this medicine. If you are using this medicine to prevent malaria, you should still protect yourself from contact with mosquitos. Stay in screened-in areas, use mosquito nets, keep your body covered, and use an insect repellent. What side effects may I notice from receiving this medicine? Side effects that you should report to your doctor or health care professional as soon as possible:  allergic reactions like skin rash, itching or hives, swelling of the face, lips, or tongue  difficulty breathing  fever  itching in the rectal or genital area  pain on swallowing  rash, fever, and swollen lymph nodes  redness, blistering, peeling or loosening of the skin, including inside the mouth  severe stomach pain or cramps  unusual bleeding or bruising  unusually weak or tired  yellowing of the eyes or skin Side effects that usually do not require medical attention (report to your doctor or health care professional if they continue or are bothersome):  diarrhea  loss of appetite  nausea, vomiting This list may  not describe all possible side effects. Call your doctor for medical advice about side effects. You may report side effects to FDA at 1-800-FDA-1088. Where should I keep my medicine? Keep out of the reach of children. Store at room temperature, below 30 degrees C (86 degrees F). Protect from light. Keep container tightly closed. Throw away any unused medicine after the expiration date. Taking this medicine after the expiration date can make you seriously ill. NOTE: This sheet is a summary. It may not cover all possible information. If you have questions about this medicine, talk to your doctor, pharmacist, or health care provider.  2021 Elsevier/Gold Standard (2018-06-10 13:44:53)

## 2020-06-22 NOTE — Telephone Encounter (Addendum)
Told Ms Kiger that her kidney function was a little elevated at 1.32.  She needs to make sure she drinks at least 64 oz a day of water.  She should avoid NSAIDs like naproxen and ibuprofen. These medications can decrease kidney function. Will send a copy of the lab work to her PCP Dr. Milagros Evener. Will proceed with her surgery on 06-27-20 as planned. Pt verbalized understanding.

## 2020-06-22 NOTE — Progress Notes (Signed)
Consult Note: Gyn-Onc  Consult was requested by Dr. Christophe Louis for the evaluation of Anna [name] 65 y.o. female  CC:  Chief Complaint  Patient presents with  . Adnexal mass  . right ovarian cyst    Assessment/Plan:  Ms. Anna [name]  is a 65 y.o.  year old with a 5cm cystic right adnexal mass.   I reviewed the CT images with the patient.  She reportedly has a normal Ca1 25, though we will follow-up this result with her ordering gynecologist office.  Based on my impression from the CT images, I have been extremely low suspicion that this is a malignancy.  I do not believe it is the cause of her symptoms of malaise, decreased appetite, and left lower quadrant discomfort.  I explained that she has 2 options to move forward.  The first most conservative option would be close surveillance with serial imaging over 5-year span.  If there is substantial change or complexity to the cystic mass demonstrated during that time.  She could proceed with surgical intervention.  Alternatively she could proceed with surgical intervention now with robotic assisted bilateral salpingo-oophorectomy, possible staging.  I explained that for her surgery might be complicated by adhesive disease due to history of diverticulitis and apparent thickening of the colonic wall on imaging.  I explained that adhesions between the bowel and the ovaries could result in injury to the underlying bowel.  This could result in serious complications and reoperation.  She has a colonoscopy scheduled for April 25.  I explained that if findings during the colonoscopy suggest that she needs surgical intervention, this may result in her having a second surgery.  Therefore I offered her delayed surgery until after her colonoscopy.  The patient elected to proceed with surgery for ovarian cyst prior to colonoscopy understanding that she may require a second surgery in the near future.  We will check renal function today given her  history of CKD.  If this is greater than 1.5, she will need to have further work-up by her internal medicine doctors prior to proceeding with surgical intervention.  She has some inflammation around the left thigh tick bite.  I removed the head of the tick today in the office there was no apparent cellulitis.  I did prescribe her empirically doxycycline for 1 week.  I explained to the patient that I do not feel that her ovarian cyst was the cause of her symptoms and therefore these were likely to persist after surgery.  Surgery has been scheduled for June 27, 2020 comprised of a robotic assisted bilateral salpingo-oophorectomy, possible staging.   HPI: Ms Anna [name] is a 65 year old P2 who was seen in consultation at the request of Dr Landry Mellow for evaluation of a right adnexal mass.  The patient was reporting left lower quadrant discomfort, malaise, decreased appetite in February 2022.  This prompted her general practitioner to order a CT scan of the abdomen and pelvis which was performed on 05/11/2020.  CT scan showed segmental thickening of the colon through the sigmoid colon.  There was scattered colonic diverticuli.  There was no apparent abscess or fluid.  The findings were equivocal for colitis.  There is a long segment of thickening away from areas of diverticular change.  There is an incidental finding of a 3.6 x 3.3 cm cystic lesion in the right ovary with possible mural nodularity in the posterior margin.  This was followed up with a transvaginal ultrasound scan of the  pelvis performed on May 30, 2020.  This revealed a surgically absent uterus.  The right ovary contained a cystic mass measuring 5.8 x 4.6 x 3.4 cm with multiple septations and a nodular focus that appeared to contain echogenic foci.  There was no free fluid.  The left ovary was not visualized.  Based on the findings of her CT scan she had a colonoscopy scheduled for July 16, 2020.  The patient saw Dr. Landry Mellow in the office  on 06/19/2020 and a Ca1 25 was drawn at that time with her the results of this analysis were not available for me at the time of my consultation.  The patient reported she had been called and notified that they were normal.  Incidentally she reported a tick bite in March 2022 this occurred on her left upper thigh.  She removed the tick but felt there was still a small area remaining and she had some erythema and rash surrounding the site.  Her medical history is most significant for obesity with a BMI of 31.5 kg meters squared.  She has chronic kidney disease and hypertension and hypercholesterolemia.  Her surgical history is most significant for a vaginal hysterectomy for fibroids and menorrhagia that was performed in 3220 and was complicated by left ureteral injury treated with percutaneous nephrostomy stenting.  She subsequently had oral surgery in 2542 which was complicated by significant perioperative bleeding.  Of note she does not not report as history of bleeding abnormalities, or history of abnormal bleeding after hysterectomy and was not felt to have a bleeding diathesis.  Her gynecologic history is remarkable for a vaginal hysterectomy for benign disease in 2003.  Her family cancer history is unremarkable.  Current Meds:  Outpatient Encounter Medications as of 06/22/2020  Medication Sig  . ALPRAZolam (XANAX) 0.5 MG tablet Take 0.5 mg by mouth daily as needed for anxiety.  Marland Kitchen atenolol (TENORMIN) 50 MG tablet Take 50 mg by mouth 2 (two) times daily.  Marland Kitchen doxycycline (VIBRA-TABS) 100 MG tablet Take 1 tablet (100 mg total) by mouth 2 (two) times daily.  Marland Kitchen FLUoxetine (PROZAC) 10 MG capsule Take 10 mg by mouth daily.  Marland Kitchen lisinopril-hydrochlorothiazide (PRINZIDE,ZESTORETIC) 20-12.5 MG tablet Take 1 tablet by mouth daily.   . metoprolol tartrate (LOPRESSOR) 25 MG tablet Take 1 tablet (25 mg total) by mouth 2 (two) times daily.  . nortriptyline (PAMELOR) 10 MG capsule Take 10 mg by mouth at bedtime.   . senna-docusate (SENOKOT-S) 8.6-50 MG tablet Take 2 tablets by mouth at bedtime. For AFTER surgery, do not take if having diarrhea  . traMADol (ULTRAM) 50 MG tablet Take 1 tablet (50 mg total) by mouth every 6 (six) hours as needed for severe pain. For AFTER surgery only, do not take and drive  . triamterene-hydrochlorothiazide (DYAZIDE) 37.5-25 MG capsule Take 1 capsule by mouth daily.   No facility-administered encounter medications on file as of 06/22/2020.    Allergy:  Allergies  Allergen Reactions  . Cymbalta [Duloxetine Hcl]     Anxiety   . Zyrtec [Cetirizine] Other (See Comments)    Anxiety     Social Hx:   Social History   Socioeconomic History  . Marital status: Divorced    Spouse name: Not on file  . Number of children: Not on file  . Years of education: Not on file  . Highest education level: Not on file  Occupational History  . Not on file  Tobacco Use  . Smoking status: Never Smoker  . Smokeless  tobacco: Never Used  Vaping Use  . Vaping Use: Not on file  Substance and Sexual Activity  . Alcohol use: Yes    Alcohol/week: 2.0 standard drinks    Types: 2 Glasses of wine per week  . Drug use: No  . Sexual activity: Never    Birth control/protection: Surgical  Other Topics Concern  . Not on file  Social History Narrative  . Not on file   Social Determinants of Health   Financial Resource Strain: Not on file  Food Insecurity: Not on file  Transportation Needs: Not on file  Physical Activity: Not on file  Stress: Not on file  Social Connections: Not on file  Intimate Partner Violence: Not on file    Past Surgical Hx:  Past Surgical History:  Procedure Laterality Date  . ABDOMINAL HYSTERECTOMY     uterus only  . MAXILLARY LE FORTE II OSTEOTOMY N/A 10/15/2016   Procedure: LEFORTE 1 MAXILLARY OSTEOTOMY WITH ADVANCEMENT AND GRAFT;  Surgeon: Jannette Fogo, DDS;  Location: [facility];  Service: Oral Surgery;  Laterality: N/A;    Past  Medical Hx:  Past Medical History:  Diagnosis Date  . Anxiety   . Arthritis   . Diverticulitis   . Fibromyalgia   . Gluten intolerance   . Hypertension   . Right ovarian cyst     Past Gynecological History:  See HPI No LMP recorded. Patient has had a hysterectomy.  Family Hx:  Family History  Problem Relation Age of Onset  . Hypertension Mother   . Heart attack Mother   . Cancer Father        lung ca  . Diabetes Father     Review of Systems:  Constitutional  Feels well,   ENT Normal appearing ears and nares bilaterally Skin/Breast  No rash, sores, jaundice, itching, dryness Cardiovascular  No chest pain, shortness of breath, or edema  Pulmonary  No cough or wheeze.  Gastro Intestinal  + left lower quadrant discomfort Genito Urinary  No frequency, urgency, dysuria, see HPI Musculo Skeletal  No myalgia, arthralgia, joint swelling or pain  Neurologic  No weakness, numbness, change in gait,  Psychology  No depression, anxiety, insomnia.   Vitals:  Blood pressure 128/80, pulse (!) 58, temperature (!) 97.3 F (36.3 C), temperature source Tympanic, resp. rate 16, height 5\' 5"  (1.651 m), weight 189 lb 6.4 oz (85.9 kg), SpO2 100 %.  Physical Exam: WD in NAD Neck  Supple NROM, without any enlargements.  Lymph Node Survey No cervical supraclavicular or inguinal adenopathy Cardiovascular  Well perfused peripheries Lungs  No increased WOB Skin  No rash/lesions/breakdown  Psychiatry  Alert and oriented to person, place, and time  Abdomen  Normoactive bowel sounds, abdomen soft, non-tender and obese without evidence of hernia.  Back No CVA tenderness Genito Urinary  Vulva/vagina: Normal external female genitalia.  No lesions. No discharge or bleeding.  Bladder/urethra:  No lesions or masses, well supported bladder  Vagina: normal  Cervix and uterus surgically absent  Adnexa: no palpable masses. Rectal  Good tone, no masses no cul de sac nodularity.   Extremities  Left upper thigh with non blanching erythema over a 3x3cm area of skin with central tick bite site with head of tick remaining embedded.  With patient's permission I prepped the overlying skin with Betadine and used sterile forceps to extract the remaining take Bentyl there was no visible foreign body material present.  60 minutes of total time was spent for this  patient encounter, including preparation, face-to-face counseling with the patient and coordination of care, review of imaging (results and images), communication with the referring provider and documentation of the encounter.   Thereasa Solo, MD  06/22/2020, 11:54 AM

## 2020-06-22 NOTE — Progress Notes (Signed)
COVID Vaccine Completed: Yes Date COVID Vaccine completed:x2 Has received booster: No COVID vaccine manufacturer:    Moderna    Date of COVID positive in last 90 days: No  PCP - Aretta Nip, MD Cardiologist - N/A  Chest x-ray - greater than 1 year in epic EKG - greater than 1 year in epic Stress Test - N/A ECHO - N/A Cardiac Cath - N/A Pacemaker/ICD device last checked:N/A  Sleep Study - N/A CPAP - N/A  Fasting Blood Sugar - N/A Checks Blood Sugar __N/A___ times a day  Blood Thinner Instructions: N/A Aspirin Instructions: N/A Last Dose: N/A  Activity level:  Able to exercise without symptoms     Anesthesia review: Difficult airway  Patient denies shortness of breath, fever, cough and chest pain at PAT appointment   Patient verbalized understanding of instructions that were given to them at the PAT appointment. Patient was also instructed that they will need to review over the PAT instructions again at home before surgery.

## 2020-06-22 NOTE — Patient Instructions (Signed)
DUE TO COVID-19 ONLY ONE VISITOR IS ALLOWED TO COME WITH YOU AND STAY IN THE WAITING ROOM ONLY DURING PRE OP AND PROCEDURE.   **NO VISITORS ARE ALLOWED IN THE SHORT STAY AREA OR RECOVERY ROOM!!**   COVID SWAB TESTING MUST BE COMPLETED ON:  Immediately after this pre op appointment   [address] [place], [place] [address]  (Must self quarantine after testing. Follow instructions on handout.)       Your procedure is scheduled on: Wednesday, June 27, 2020   Report to [facility] Main  Entrance    Report to admitting at 10:00 AM   Call this number if you have problems the morning of surgery [phone]   Do not eat food :After Midnight.   May have liquids until 9:00 AM   day of surgery  CLEAR LIQUID DIET  Foods Allowed                                                                     Foods Excluded  Water, Black Coffee and tea, regular and decaf                             liquids that you cannot  Plain Jell-O in any flavor  (No red)                                           see through such as: Fruit ices (not with fruit pulp)                                     milk, soups, orange juice              Iced Popsicles (No red)                                    All solid food                                   Apple juices Sports drinks like Gatorade (No red) Lightly seasoned clear broth or consume(fat free) Sugar, honey syrup  Sample Menu Breakfast                                Lunch                                     Supper Cranberry juice                    Beef broth                            Chicken broth Jell-O  Grape juice                           Apple juice Coffee or tea                        Jell-O                                      Popsicle                                                Coffee or tea                        Coffee or tea    Oral Hygiene is also important to reduce your risk of infection.                                     Remember - BRUSH YOUR TEETH THE MORNING OF SURGERY WITH YOUR REGULAR TOOTHPASTE   Do NOT smoke after Midnight   Take these medicines the morning of surgery with A SIP OF WATER: Atenolol, Fluoxetine, Xanax if needed                               You may not have any metal on your body including hair pins, jewelry, and body piercings             Do not wear make-up, lotions, powders, perfumes/cologne, or deodorant             Do not wear nail polish.  Do not shave  48 hours prior to surgery.                Do not bring valuables to the hospital. [place].   Contacts, dentures or bridgework may not be worn into surgery.    Patients discharged the day of surgery will not be allowed to drive home.   Special Instructions: Bring a copy of your healthcare power of attorney and living will documents         the day of surgery if you haven't scanned them in before.              Please read over the following fact sheets you were given: IF YOU HAVE QUESTIONS ABOUT YOUR PRE OP INSTRUCTIONS PLEASE CALL [phone]   [facility] - Preparing for Surgery Before surgery, you can play an important role.  Because skin is not sterile, your skin needs to be as free of germs as possible.  You can reduce the number of germs on your skin by washing with CHG (chlorahexidine gluconate) soap before surgery.  CHG is an antiseptic cleaner which kills germs and bonds with the skin to continue killing germs even after washing. Please DO NOT use if you have an allergy to CHG or antibacterial soaps.  If your skin becomes reddened/irritated stop using the CHG and inform your nurse when you  arrive at Short Stay. Do not shave (including legs and underarms) for at least 48 hours prior to the first CHG shower.  You may shave your face/neck.  Please follow these instructions carefully:  1.  Shower with CHG Soap the night before surgery and the  morning of  surgery.  2.  If you choose to wash your hair, wash your hair first as usual with your normal  shampoo.  3.  After you shampoo, rinse your hair and body thoroughly to remove the shampoo.                             4.  Use CHG as you would any other liquid soap.  You can apply chg directly to the skin and wash.  Gently with a scrungie or clean washcloth.  5.  Apply the CHG Soap to your body ONLY FROM THE NECK DOWN.   Do   not use on face/ open                           Wound or open sores. Avoid contact with eyes, ears mouth and   genitals (private parts).                       Wash face,  Genitals (private parts) with your normal soap.             6.  Wash thoroughly, paying special attention to the area where your    surgery  will be performed.  7.  Thoroughly rinse your body with warm water from the neck down.  8.  DO NOT shower/wash with your normal soap after using and rinsing off the CHG Soap.                9.  Pat yourself dry with a clean towel.            10.  Wear clean pajamas.            11.  Place clean sheets on your bed the night of your first shower and do not  sleep with pets. Day of Surgery : Do not apply any lotions/deodorants the morning of surgery.  Please wear clean clothes to the hospital/surgery center.  FAILURE TO FOLLOW THESE INSTRUCTIONS MAY RESULT IN THE CANCELLATION OF YOUR SURGERY  PATIENT SIGNATURE_________________________________  NURSE SIGNATURE__________________________________  ________________________________________________________________________  WHAT IS A BLOOD TRANSFUSION? Blood Transfusion Information  A transfusion is the replacement of blood or some of its parts. Blood is made up of multiple cells which provide different functions.  Red blood cells carry oxygen and are used for blood loss replacement.  White blood cells fight against infection.  Platelets control bleeding.  Plasma helps clot blood.  Other blood products are available  for specialized needs, such as hemophilia or other clotting disorders. BEFORE THE TRANSFUSION  Who gives blood for transfusions?   Healthy volunteers who are fully evaluated to make sure their blood is safe. This is blood bank blood. Transfusion therapy is the safest it has ever been in the practice of medicine. Before blood is taken from a donor, a complete history is taken to make sure that person has no history of diseases nor engages in risky social behavior (examples are intravenous drug use or sexual activity with multiple partners). The donor's travel history is screened to minimize risk of transmitting infections, such  as malaria. The donated blood is tested for signs of infectious diseases, such as HIV and hepatitis. The blood is then tested to be sure it is compatible with you in order to minimize the chance of a transfusion reaction. If you or a relative donates blood, this is often done in anticipation of surgery and is not appropriate for emergency situations. It takes many days to process the donated blood. RISKS AND COMPLICATIONS Although transfusion therapy is very safe and saves many lives, the main dangers of transfusion include:   Getting an infectious disease.  Developing a transfusion reaction. This is an allergic reaction to something in the blood you were given. Every precaution is taken to prevent this. The decision to have a blood transfusion has been considered carefully by your caregiver before blood is given. Blood is not given unless the benefits outweigh the risks. AFTER THE TRANSFUSION  Right after receiving a blood transfusion, you will usually feel much better and more energetic. This is especially true if your red blood cells have gotten low (anemic). The transfusion raises the level of the red blood cells which carry oxygen, and this usually causes an energy increase.  The nurse administering the transfusion will monitor you carefully for complications. HOME CARE  INSTRUCTIONS  No special instructions are needed after a transfusion. You may find your energy is better. Speak with your caregiver about any limitations on activity for underlying diseases you may have. SEEK MEDICAL CARE IF:   Your condition is not improving after your transfusion.  You develop redness or irritation at the intravenous (IV) site. SEEK IMMEDIATE MEDICAL CARE IF:  Any of the following symptoms occur over the next 12 hours:  Shaking chills.  You have a temperature by mouth above 102 F (38.9 C), not controlled by medicine.  Chest, back, or muscle pain.  People around you feel you are not acting correctly or are confused.  Shortness of breath or difficulty breathing.  Dizziness and fainting.  You get a rash or develop hives.  You have a decrease in urine output.  Your urine turns a dark color or changes to pink, red, or brown. Any of the following symptoms occur over the next 10 days:  You have a temperature by mouth above 102 F (38.9 C), not controlled by medicine.  Shortness of breath.  Weakness after normal activity.  The white part of the eye turns yellow (jaundice).  You have a decrease in the amount of urine or are urinating less often.  Your urine turns a dark color or changes to pink, red, or brown. Document Released: 03/07/2000 Document Revised: 06/02/2011 Document Reviewed: 10/25/2007 [facility] Patient Information 2014 [place], [place].  _______________________________________________________________________

## 2020-06-25 ENCOUNTER — Telehealth: Payer: Self-pay

## 2020-06-25 ENCOUNTER — Other Ambulatory Visit (HOSPITAL_COMMUNITY)
Admission: RE | Admit: 2020-06-25 | Discharge: 2020-06-25 | Disposition: A | Discharge status: Home / Self Care | Payer: 59 | Source: Ambulatory Visit | Attending: Gynecologic Oncology | Admitting: Gynecologic Oncology

## 2020-06-25 ENCOUNTER — Other Ambulatory Visit: Payer: Self-pay

## 2020-06-25 ENCOUNTER — Encounter (HOSPITAL_COMMUNITY)
Admission: RE | Admit: 2020-06-25 | Discharge: 2020-06-25 | Disposition: A | Discharge status: Home / Self Care | Payer: 59 | Source: Ambulatory Visit | Attending: Gynecologic Oncology | Admitting: Gynecologic Oncology

## 2020-06-25 DIAGNOSIS — Z01812 Encounter for preprocedural laboratory examination: Secondary | ICD-10-CM | POA: Insufficient documentation

## 2020-06-25 DIAGNOSIS — Z20822 Contact with and (suspected) exposure to covid-19: Secondary | ICD-10-CM | POA: Insufficient documentation

## 2020-06-25 DIAGNOSIS — Z01818 Encounter for other preprocedural examination: Secondary | ICD-10-CM | POA: Insufficient documentation

## 2020-06-25 LAB — CBC
HCT: 38.7 % (ref 36.0–46.0)
Hemoglobin: 12.7 g/dL (ref 12.0–15.0)
MCH: 32.6 pg (ref 26.0–34.0)
MCHC: 32.8 g/dL (ref 30.0–36.0)
MCV: 99.5 fL (ref 80.0–100.0)
Platelets: 210 10*3/uL (ref 150–400)
RBC: 3.89 MIL/uL (ref 3.87–5.11)
RDW: 11.6 % (ref 11.5–15.5)
WBC: 5.4 10*3/uL (ref 4.0–10.5)
nRBC: 0 % (ref 0.0–0.2)

## 2020-06-25 NOTE — Telephone Encounter (Signed)
Told Ms Flud that her surgery for 06-27-20 was prior authorized per Joylene John, NP.

## 2020-06-26 ENCOUNTER — Telehealth: Payer: Self-pay

## 2020-06-26 LAB — SARS CORONAVIRUS 2 (TAT 6-24 HRS): SARS Coronavirus 2: NEGATIVE

## 2020-06-26 NOTE — Progress Notes (Signed)
Anesthesia Chart Review   Case: [id number] Date/Time: 06/27/20 1145   Procedure: XI ROBOTIC ASSISTED BILATERAL SALPINGO OOPHORECTOMY WITH POSSIBLE STAGING (Bilateral )   Anesthesia type: General   Pre-op diagnosis: RIGHT OVARIAN CYST   [facility] ROOM 02 / [facility]   Surgeons: Everitt Amber, MD      DISCUSSION:65 y.o. never smoker with h/o HTN, right ovarian cyst scheduled for above procedure 06/27/2020 with Dr. Everitt Amber.   Difficult airway per anesthesia note 10/15/2016. Per note difficulty due to anterior larynx.  Recommendation, "induction with short-acting agent, and alternative techniques readily available. Comments: Easy mask airway. Glidescope used d/t anticipated anterior airway. CRNA unable to view cords on 1st attempt. Partial view of cords obtained by Dr. Royce Macadamia. Dr. Royce Macadamia used [place] to direct nasal tube anteriorly. Atraumatic nasal intubation. VSS."  Anticipate pt can proceed with planned procedure barring acute status change.   VS: BP 120/71   Pulse (!) 57   Temp 36.8 C (Oral)   Resp 16   Ht 5\' 5"  (1.651 m)   Wt 85.5 kg   SpO2 100%   BMI 31.35 kg/m   PROVIDERS: Rankins, Bill Salinas, MD is PCP    LABS: Labs reviewed: Acceptable for surgery. (all labs ordered are listed, but only abnormal results are displayed)  Labs Reviewed  CBC  TYPE AND SCREEN     IMAGES:   EKG: 06/25/20 Rate 58 bpm  Sinus bradycardia  No significant change since last tracing  CV:  Past Medical History:  Diagnosis Date  . Adnexal mass   . Anemia   . Anxiety   . Arthritis   . Diverticulitis   . Elevated serum creatinine   . Fibromyalgia   . Gluten intolerance   . Hearing loss    Right  . History of blood transfusion 2018   2 units during surgery  . Hypertension   . Right ovarian cyst     Past Surgical History:  Procedure Laterality Date  . ABDOMINAL HYSTERECTOMY     uterus only  . MAXILLARY LE FORTE II OSTEOTOMY N/A 10/15/2016   Procedure: LEFORTE 1 MAXILLARY OSTEOTOMY  WITH ADVANCEMENT AND GRAFT;  Surgeon: Jannette Fogo, DDS;  Location: [facility];  Service: Oral Surgery;  Laterality: N/A;    MEDICATIONS: . ALPRAZolam (XANAX) 0.5 MG tablet  . atenolol (TENORMIN) 50 MG tablet  . doxycycline (VIBRA-TABS) 100 MG tablet  . FLUoxetine (PROZAC) 10 MG capsule  . lisinopril-hydrochlorothiazide (PRINZIDE,ZESTORETIC) 20-12.5 MG tablet  . metoprolol tartrate (LOPRESSOR) 25 MG tablet  . nortriptyline (PAMELOR) 10 MG capsule  . senna-docusate (SENOKOT-S) 8.6-50 MG tablet  . traMADol (ULTRAM) 50 MG tablet  . triamterene-hydrochlorothiazide (DYAZIDE) 37.5-25 MG capsule   No current facility-administered medications for this encounter.     Konrad Felix, PA-C [facility] Pre-Surgical Testing [phone]

## 2020-06-26 NOTE — Anesthesia Preprocedure Evaluation (Addendum)
Anesthesia Evaluation  Patient identified by MRN, date of birth, ID band Patient awake    Reviewed: Allergy & Precautions, NPO status , Patient's Chart, lab work & pertinent test results, reviewed documented beta blocker date and time   History of Anesthesia Complications (+) DIFFICULT AIRWAY and history of anesthetic complications  Airway Mallampati: II  TM Distance: >3 FB Neck ROM: Full    Dental  (+) Teeth Intact, Dental Advisory Given   Pulmonary neg pulmonary ROS,    Pulmonary exam normal breath sounds clear to auscultation       Cardiovascular hypertension, Pt. on medications and Pt. on home beta blockers Normal cardiovascular exam Rhythm:Regular Rate:Normal     Neuro/Psych PSYCHIATRIC DISORDERS Anxiety    GI/Hepatic negative GI ROS, Neg liver ROS,   Endo/Other  negative endocrine ROS  Renal/GU Renal InsufficiencyRenal diseaseCr 1.32  negative genitourinary   Musculoskeletal  (+) Arthritis , Osteoarthritis,  Fibromyalgia -  Abdominal   Peds  Hematology hct 38.7  Significant bleeding during surgery for leforte osteotomy in 2018- transferred to [facility] for extubation to ensure availability of ENT in case of need for surgical airway. Required blood transfusion   Anesthesia Other Findings Difficult airway per anesthesia note 10/15/2016. Per note difficulty due to anterior larynx.  Recommendation, "induction with short-acting agent, and alternative techniques readily available. Comments: Easy mask airway. Glidescope used d/t anticipated anterior airway. CRNA unable to view cords on 1st attempt. Partial view of cords obtained by Dr. Royce Macadamia. Dr. Royce Macadamia used [place] to direct nasal tube anteriorly. Atraumatic nasal intubation.    Osteotomy in 2018 for severe TMJ, which has improved since surgery  Reproductive/Obstetrics negative OB ROS                           Anesthesia  Physical Anesthesia Plan  ASA: II  Anesthesia Plan: General   Post-op Pain Management:    Induction: Intravenous  PONV Risk Score and Plan: 4 or greater and Ondansetron, Dexamethasone, Midazolam and Treatment may vary due to age or medical condition  Airway Management Planned: Oral ETT  Additional Equipment: None  Intra-op Plan:   Post-operative Plan: Extubation in OR  Informed Consent: I have reviewed the patients History and Physical, chart, labs and discussed the procedure including the risks, benefits and alternatives for the proposed anesthesia with the patient or authorized representative who has indicated his/her understanding and acceptance.     Dental advisory given  Plan Discussed with: CRNA  Anesthesia Plan Comments:       Anesthesia Quick Evaluation

## 2020-06-26 NOTE — Telephone Encounter (Signed)
Anna [name] states that she understands her written pre-op instructions for surgery tomorrow. No questions or concerns noted at this time.

## 2020-06-27 ENCOUNTER — Encounter (HOSPITAL_COMMUNITY)
Admission: RE | Disposition: A | Discharge status: Home / Self Care | Payer: Self-pay | Source: Ambulatory Visit | Attending: Gynecologic Oncology

## 2020-06-27 ENCOUNTER — Ambulatory Visit (HOSPITAL_COMMUNITY): Payer: 59 | Admitting: Certified Registered Nurse Anesthetist

## 2020-06-27 ENCOUNTER — Ambulatory Visit (HOSPITAL_COMMUNITY)
Admission: RE | Admit: 2020-06-27 | Discharge: 2020-06-27 | Disposition: A | Discharge status: Home / Self Care | Payer: 59 | Source: Ambulatory Visit | Attending: Gynecologic Oncology | Admitting: Gynecologic Oncology

## 2020-06-27 ENCOUNTER — Encounter (HOSPITAL_COMMUNITY): Payer: Self-pay | Admitting: Gynecologic Oncology

## 2020-06-27 ENCOUNTER — Ambulatory Visit (HOSPITAL_COMMUNITY): Payer: 59 | Admitting: Physician Assistant

## 2020-06-27 DIAGNOSIS — N9489 Other specified conditions associated with female genital organs and menstrual cycle: Secondary | ICD-10-CM

## 2020-06-27 DIAGNOSIS — E7800 Pure hypercholesterolemia, unspecified: Secondary | ICD-10-CM | POA: Diagnosis not present

## 2020-06-27 DIAGNOSIS — D270 Benign neoplasm of right ovary: Secondary | ICD-10-CM | POA: Diagnosis not present

## 2020-06-27 DIAGNOSIS — N83201 Unspecified ovarian cyst, right side: Secondary | ICD-10-CM | POA: Diagnosis present

## 2020-06-27 DIAGNOSIS — N189 Chronic kidney disease, unspecified: Secondary | ICD-10-CM | POA: Diagnosis not present

## 2020-06-27 DIAGNOSIS — Z90710 Acquired absence of both cervix and uterus: Secondary | ICD-10-CM | POA: Diagnosis not present

## 2020-06-27 DIAGNOSIS — Z6831 Body mass index (BMI) 31.0-31.9, adult: Secondary | ICD-10-CM | POA: Diagnosis not present

## 2020-06-27 DIAGNOSIS — D271 Benign neoplasm of left ovary: Secondary | ICD-10-CM | POA: Insufficient documentation

## 2020-06-27 DIAGNOSIS — E669 Obesity, unspecified: Secondary | ICD-10-CM | POA: Insufficient documentation

## 2020-06-27 DIAGNOSIS — N736 Female pelvic peritoneal adhesions (postinfective): Secondary | ICD-10-CM | POA: Diagnosis not present

## 2020-06-27 DIAGNOSIS — Z888 Allergy status to other drugs, medicaments and biological substances status: Secondary | ICD-10-CM | POA: Insufficient documentation

## 2020-06-27 DIAGNOSIS — Z79899 Other long term (current) drug therapy: Secondary | ICD-10-CM | POA: Diagnosis not present

## 2020-06-27 DIAGNOSIS — I129 Hypertensive chronic kidney disease with stage 1 through stage 4 chronic kidney disease, or unspecified chronic kidney disease: Secondary | ICD-10-CM | POA: Diagnosis not present

## 2020-06-27 HISTORY — DX: Other specified conditions associated with female genital organs and menstrual cycle: N94.89

## 2020-06-27 HISTORY — DX: Anemia, unspecified: D64.9

## 2020-06-27 HISTORY — DX: Unspecified hearing loss, unspecified ear: H91.90

## 2020-06-27 HISTORY — PX: ROBOTIC ASSISTED BILATERAL SALPINGO OOPHERECTOMY: SHX6078

## 2020-06-27 HISTORY — DX: Other specified abnormal findings of blood chemistry: R79.89

## 2020-06-27 LAB — TYPE AND SCREEN
ABO/RH(D): A NEG
Antibody Screen: NEGATIVE

## 2020-06-27 SURGERY — SALPINGO-OOPHORECTOMY, BILATERAL, ROBOT-ASSISTED
Anesthesia: General | Site: Abdomen | Laterality: Bilateral

## 2020-06-27 MED ORDER — GABAPENTIN 100 MG PO CAPS
200.0000 mg | ORAL_CAPSULE | ORAL | Status: AC
Start: 2020-06-27 — End: 2020-06-27
  Administered 2020-06-27: 200 mg via ORAL
  Filled 2020-06-27: qty 2

## 2020-06-27 MED ORDER — LIDOCAINE 2% (20 MG/ML) 5 ML SYRINGE
INTRAMUSCULAR | Status: DC | PRN
  Administered 2020-06-27: 60 mg via INTRAVENOUS

## 2020-06-27 MED ORDER — OXYCODONE HCL 5 MG PO TABS
ORAL_TABLET | ORAL | Status: AC
  Filled 2020-06-27: qty 1

## 2020-06-27 MED ORDER — DEXAMETHASONE SODIUM PHOSPHATE 10 MG/ML IJ SOLN
INTRAMUSCULAR | Status: AC
  Filled 2020-06-27: qty 1

## 2020-06-27 MED ORDER — FENTANYL CITRATE (PF) 100 MCG/2ML IJ SOLN
INTRAMUSCULAR | Status: AC
  Filled 2020-06-27: qty 2

## 2020-06-27 MED ORDER — ENOXAPARIN SODIUM 40 MG/0.4ML ~~LOC~~ SOLN
40.0000 mg | SUBCUTANEOUS | Status: AC
  Administered 2020-06-27: 40 mg via SUBCUTANEOUS
  Filled 2020-06-27: qty 0.4

## 2020-06-27 MED ORDER — STERILE WATER FOR IRRIGATION IR SOLN
Status: DC | PRN
  Administered 2020-06-27: 1000 mL

## 2020-06-27 MED ORDER — HYDRALAZINE HCL 20 MG/ML IJ SOLN
INTRAMUSCULAR | Status: DC | PRN
  Administered 2020-06-27: 5 mg via INTRAVENOUS

## 2020-06-27 MED ORDER — ORAL CARE MOUTH RINSE: 15.0000 mL | Freq: Once | OROMUCOSAL | Status: AC

## 2020-06-27 MED ORDER — ACETAMINOPHEN 500 MG PO TABS
1000.0000 mg | ORAL_TABLET | ORAL | Status: AC
  Administered 2020-06-27: 1000 mg via ORAL
  Filled 2020-06-27: qty 2

## 2020-06-27 MED ORDER — DEXAMETHASONE SODIUM PHOSPHATE 10 MG/ML IJ SOLN
INTRAMUSCULAR | Status: DC | PRN
  Administered 2020-06-27: 8 mg via INTRAVENOUS

## 2020-06-27 MED ORDER — ONDANSETRON HCL 4 MG/2ML IJ SOLN
INTRAMUSCULAR | Status: DC | PRN
  Administered 2020-06-27: 4 mg via INTRAVENOUS

## 2020-06-27 MED ORDER — SUCCINYLCHOLINE CHLORIDE 200 MG/10ML IV SOSY
PREFILLED_SYRINGE | INTRAVENOUS | Status: DC | PRN
  Administered 2020-06-27: 100 mg via INTRAVENOUS

## 2020-06-27 MED ORDER — PROPOFOL 10 MG/ML IV BOLUS
INTRAVENOUS | Status: DC | PRN
  Administered 2020-06-27: 150 mg via INTRAVENOUS
  Administered 2020-06-27: 50 mg via INTRAVENOUS

## 2020-06-27 MED ORDER — BUPIVACAINE HCL 0.25 % IJ SOLN
INTRAMUSCULAR | Status: DC | PRN
  Administered 2020-06-27: 16 mL

## 2020-06-27 MED ORDER — LIDOCAINE 2% (20 MG/ML) 5 ML SYRINGE
INTRAMUSCULAR | Status: AC
  Filled 2020-06-27: qty 10

## 2020-06-27 MED ORDER — ACETAMINOPHEN 500 MG PO TABS: 1000.0000 mg | ORAL_TABLET | Freq: Once | ORAL | Status: DC

## 2020-06-27 MED ORDER — SENNOSIDES-DOCUSATE SODIUM 8.6-50 MG PO TABS: 2.0000 | ORAL_TABLET | Freq: Every day | ORAL | 0 refills | Status: DC

## 2020-06-27 MED ORDER — PROPOFOL 10 MG/ML IV BOLUS
INTRAVENOUS | Status: AC
  Filled 2020-06-27: qty 20

## 2020-06-27 MED ORDER — OXYCODONE HCL 5 MG PO TABS: 5.0000 mg | ORAL_TABLET | ORAL | Status: DC | PRN

## 2020-06-27 MED ORDER — SUGAMMADEX SODIUM 500 MG/5ML IV SOLN
INTRAVENOUS | Status: AC
  Filled 2020-06-27: qty 5

## 2020-06-27 MED ORDER — LACTATED RINGERS IR SOLN
Status: DC | PRN
  Administered 2020-06-27: 1000 mL

## 2020-06-27 MED ORDER — TRAMADOL HCL 50 MG PO TABS: 50.0000 mg | ORAL_TABLET | Freq: Four times a day (QID) | ORAL | Status: DC | PRN

## 2020-06-27 MED ORDER — ROCURONIUM BROMIDE 10 MG/ML (PF) SYRINGE
PREFILLED_SYRINGE | INTRAVENOUS | Status: AC
  Filled 2020-06-27: qty 10

## 2020-06-27 MED ORDER — LIDOCAINE HCL (PF) 2 % IJ SOLN
INTRAMUSCULAR | Status: DC | PRN
  Administered 2020-06-27: 1.5 mg/kg/h via INTRADERMAL

## 2020-06-27 MED ORDER — MIDAZOLAM HCL 2 MG/2ML IJ SOLN
INTRAMUSCULAR | Status: AC
  Filled 2020-06-27: qty 2

## 2020-06-27 MED ORDER — SUGAMMADEX SODIUM 200 MG/2ML IV SOLN
INTRAVENOUS | Status: DC | PRN
  Administered 2020-06-27: 350 mg via INTRAVENOUS

## 2020-06-27 MED ORDER — KETAMINE HCL 10 MG/ML IJ SOLN
INTRAMUSCULAR | Status: AC
  Filled 2020-06-27: qty 1

## 2020-06-27 MED ORDER — BUPIVACAINE HCL 0.25 % IJ SOLN
INTRAMUSCULAR | Status: AC
  Filled 2020-06-27: qty 1

## 2020-06-27 MED ORDER — SUCCINYLCHOLINE CHLORIDE 200 MG/10ML IV SOSY
PREFILLED_SYRINGE | INTRAVENOUS | Status: AC
  Filled 2020-06-27: qty 10

## 2020-06-27 MED ORDER — LACTATED RINGERS IV SOLN: INTRAVENOUS | Status: DC

## 2020-06-27 MED ORDER — PROMETHAZINE HCL 25 MG/ML IJ SOLN: 6.2500 mg | INTRAMUSCULAR | Status: DC | PRN

## 2020-06-27 MED ORDER — SCOPOLAMINE 1 MG/3DAYS TD PT72
1.0000 | MEDICATED_PATCH | TRANSDERMAL | Status: DC
  Administered 2020-06-27: 1.5 mg via TRANSDERMAL
  Filled 2020-06-27: qty 1

## 2020-06-27 MED ORDER — MIDAZOLAM HCL 5 MG/5ML IJ SOLN
INTRAMUSCULAR | Status: DC | PRN
  Administered 2020-06-27: 2 mg via INTRAVENOUS

## 2020-06-27 MED ORDER — ROCURONIUM BROMIDE 10 MG/ML (PF) SYRINGE
PREFILLED_SYRINGE | INTRAVENOUS | Status: DC | PRN
  Administered 2020-06-27: 50 mg via INTRAVENOUS
  Administered 2020-06-27: 30 mg via INTRAVENOUS

## 2020-06-27 MED ORDER — OXYCODONE HCL 5 MG/5ML PO SOLN
5.0000 mg | Freq: Once | ORAL | Status: AC | PRN
Start: 2020-06-27 — End: 2020-06-27

## 2020-06-27 MED ORDER — OXYCODONE HCL 5 MG PO TABS
5.0000 mg | ORAL_TABLET | Freq: Once | ORAL | Status: AC | PRN
Start: 2020-06-27 — End: 2020-06-27
  Administered 2020-06-27: 5 mg via ORAL

## 2020-06-27 MED ORDER — CEFAZOLIN SODIUM-DEXTROSE 2-4 GM/100ML-% IV SOLN
2.0000 g | INTRAVENOUS | Status: AC
  Administered 2020-06-27: 2 g via INTRAVENOUS
  Filled 2020-06-27: qty 100

## 2020-06-27 MED ORDER — CHLORHEXIDINE GLUCONATE 0.12 % MT SOLN
15.0000 mL | Freq: Once | OROMUCOSAL | Status: AC
  Administered 2020-06-27: 15 mL via OROMUCOSAL

## 2020-06-27 MED ORDER — FENTANYL CITRATE (PF) 100 MCG/2ML IJ SOLN
INTRAMUSCULAR | Status: DC | PRN
  Administered 2020-06-27 (×2): 50 ug via INTRAVENOUS

## 2020-06-27 MED ORDER — MEPERIDINE HCL 50 MG/ML IJ SOLN: 6.2500 mg | INTRAMUSCULAR | Status: DC | PRN

## 2020-06-27 MED ORDER — KETAMINE HCL 10 MG/ML IJ SOLN
INTRAMUSCULAR | Status: DC | PRN
  Administered 2020-06-27: 50 mg via INTRAVENOUS

## 2020-06-27 MED ORDER — DEXAMETHASONE SODIUM PHOSPHATE 4 MG/ML IJ SOLN: 4.0000 mg | INTRAMUSCULAR | Status: DC

## 2020-06-27 MED ORDER — LIDOCAINE 2% (20 MG/ML) 5 ML SYRINGE
INTRAMUSCULAR | Status: AC
  Filled 2020-06-27: qty 5

## 2020-06-27 MED ORDER — EPHEDRINE SULFATE 50 MG/ML IJ SOLN
INTRAMUSCULAR | Status: DC | PRN
  Administered 2020-06-27: 5 mg via INTRAVENOUS
  Administered 2020-06-27: 10 mg via INTRAVENOUS
  Administered 2020-06-27: 5 mg via INTRAVENOUS
  Administered 2020-06-27: 10 mg via INTRAVENOUS

## 2020-06-27 MED ORDER — ONDANSETRON HCL 4 MG/2ML IJ SOLN
INTRAMUSCULAR | Status: AC
  Filled 2020-06-27: qty 2

## 2020-06-27 MED ORDER — HYDROMORPHONE HCL 1 MG/ML IJ SOLN
0.2500 mg | INTRAMUSCULAR | Status: DC | PRN
Start: 2020-06-27 — End: 2020-06-27

## 2020-06-27 MED ORDER — TRAMADOL HCL 50 MG PO TABS: 50.0000 mg | ORAL_TABLET | Freq: Four times a day (QID) | ORAL | 0 refills | Status: DC | PRN

## 2020-06-27 MED ORDER — ALBUMIN HUMAN 5 % IV SOLN: INTRAVENOUS | Status: DC | PRN

## 2020-06-27 MED ORDER — LACTATED RINGERS IV SOLN: INTRAVENOUS | Status: DC | PRN

## 2020-06-27 SURGICAL SUPPLY — 72 items
APPLICATOR SURGIFLO ENDO (HEMOSTASIS) IMPLANT
BACTOSHIELD CHG 4% 4OZ (MISCELLANEOUS) ×1
BAG LAPAROSCOPIC 12 15 PORT 16 (BASKET) IMPLANT
BAG RETRIEVAL 12/15 (BASKET)
BLADE SURG SZ10 CARB STEEL (BLADE) IMPLANT
CELLS DAT CNTRL 66122 CELL SVR (MISCELLANEOUS) IMPLANT
COVER BACK TABLE 60X90IN (DRAPES) ×2 IMPLANT
COVER TIP SHEARS 8 DVNC (MISCELLANEOUS) ×1 IMPLANT
COVER TIP SHEARS 8MM DA VINCI (MISCELLANEOUS) ×2
COVER WAND RF STERILE (DRAPES) IMPLANT
DECANTER SPIKE VIAL GLASS SM (MISCELLANEOUS) IMPLANT
DERMABOND ADVANCED (GAUZE/BANDAGES/DRESSINGS) ×1
DERMABOND ADVANCED .7 DNX12 (GAUZE/BANDAGES/DRESSINGS) ×1 IMPLANT
DRAPE ARM DVNC X/XI (DISPOSABLE) ×4 IMPLANT
DRAPE COLUMN DVNC XI (DISPOSABLE) ×1 IMPLANT
DRAPE DA VINCI XI ARM (DISPOSABLE) ×8
DRAPE DA VINCI XI COLUMN (DISPOSABLE) ×2
DRAPE SHEET LG 3/4 BI-LAMINATE (DRAPES) ×2 IMPLANT
DRAPE SURG IRRIG POUCH 19X23 (DRAPES) ×2 IMPLANT
DRSG OPSITE POSTOP 4X6 (GAUZE/BANDAGES/DRESSINGS) IMPLANT
DRSG OPSITE POSTOP 4X8 (GAUZE/BANDAGES/DRESSINGS) IMPLANT
ELECT PENCIL ROCKER SW 15FT (MISCELLANEOUS) IMPLANT
ELECT REM PT RETURN 15FT ADLT (MISCELLANEOUS) ×2 IMPLANT
GLOVE SURG ENC MOIS LTX SZ6 (GLOVE) ×8 IMPLANT
GLOVE SURG ENC MOIS LTX SZ6.5 (GLOVE) ×4 IMPLANT
GOWN STRL REUS W/ TWL LRG LVL3 (GOWN DISPOSABLE) ×4 IMPLANT
GOWN STRL REUS W/TWL LRG LVL3 (GOWN DISPOSABLE) ×8
HOLDER FOLEY CATH W/STRAP (MISCELLANEOUS) IMPLANT
IRRIG SUCT STRYKERFLOW 2 WTIP (MISCELLANEOUS) ×2
IRRIGATION SUCT STRKRFLW 2 WTP (MISCELLANEOUS) ×1 IMPLANT
KIT PROCEDURE DA VINCI SI (MISCELLANEOUS)
KIT PROCEDURE DVNC SI (MISCELLANEOUS) IMPLANT
KIT TURNOVER KIT A (KITS) ×2 IMPLANT
MANIPULATOR UTERINE 4.5 ZUMI (MISCELLANEOUS) ×2 IMPLANT
NEEDLE HYPO 22GX1.5 SAFETY (NEEDLE) ×2 IMPLANT
NEEDLE SPNL 18GX3.5 QUINCKE PK (NEEDLE) IMPLANT
OBTURATOR OPTICAL STANDARD 8MM (TROCAR) ×2
OBTURATOR OPTICAL STND 8 DVNC (TROCAR) ×1
OBTURATOR OPTICALSTD 8 DVNC (TROCAR) ×1 IMPLANT
PACK ROBOT GYN CUSTOM WL (TRAY / TRAY PROCEDURE) ×2 IMPLANT
PAD POSITIONING PINK XL (MISCELLANEOUS) ×2 IMPLANT
PORT ACCESS TROCAR AIRSEAL 12 (TROCAR) ×1 IMPLANT
PORT ACCESS TROCAR AIRSEAL 5M (TROCAR) ×1
POUCH SPECIMEN RETRIEVAL 10MM (ENDOMECHANICALS) IMPLANT
RETRACTOR WND ALEXIS 25 LRG (MISCELLANEOUS) IMPLANT
RTRCTR WOUND ALEXIS 18CM MED (MISCELLANEOUS)
RTRCTR WOUND ALEXIS 25CM LRG (MISCELLANEOUS)
SCRUB CHG 4% DYNA-HEX 4OZ (MISCELLANEOUS) ×1 IMPLANT
SEAL CANN UNIV 5-8 DVNC XI (MISCELLANEOUS) ×3 IMPLANT
SEAL XI 5MM-8MM UNIVERSAL (MISCELLANEOUS) ×6
SET TRI-LUMEN FLTR TB AIRSEAL (TUBING) ×2 IMPLANT
SPONGE LAP 18X18 RF (DISPOSABLE) IMPLANT
SURGIFLO W/THROMBIN 8M KIT (HEMOSTASIS) IMPLANT
SUT MNCRL AB 4-0 PS2 18 (SUTURE) IMPLANT
SUT PDS AB 1 TP1 96 (SUTURE) IMPLANT
SUT VIC AB 0 CT1 27 (SUTURE) ×2
SUT VIC AB 0 CT1 27XBRD ANTBC (SUTURE) ×1 IMPLANT
SUT VIC AB 2-0 CT1 27 (SUTURE)
SUT VIC AB 2-0 CT1 TAPERPNT 27 (SUTURE) IMPLANT
SUT VIC AB 4-0 PS2 18 (SUTURE) ×4 IMPLANT
SUT VLOC 180 0 9IN  GS21 (SUTURE) ×2
SUT VLOC 180 0 9IN GS21 (SUTURE) ×1 IMPLANT
SYR 10ML LL (SYRINGE) IMPLANT
SYR 20ML LL LF (SYRINGE) ×2 IMPLANT
SYR 50ML LL SCALE MARK (SYRINGE) ×2 IMPLANT
TOWEL OR NON WOVEN STRL DISP B (DISPOSABLE) ×2 IMPLANT
TRAP SPECIMEN MUCUS 40CC (MISCELLANEOUS) ×2 IMPLANT
TRAY FOLEY MTR SLVR 16FR STAT (SET/KITS/TRAYS/PACK) ×2 IMPLANT
TROCAR XCEL NON-BLD 5MMX100MML (ENDOMECHANICALS) IMPLANT
UNDERPAD 30X36 HEAVY ABSORB (UNDERPADS AND DIAPERS) ×2 IMPLANT
WATER STERILE IRR 1000ML POUR (IV SOLUTION) ×2 IMPLANT
YANKAUER SUCT BULB TIP 10FT TU (MISCELLANEOUS) IMPLANT

## 2020-06-27 NOTE — Transfer of Care (Signed)
Immediate Anesthesia Transfer of Care Note  Patient: Anna [name]  Procedure(s) Performed: XI ROBOTIC ASSISTED BILATERAL SALPINGO OOPHORECTOMY (Bilateral Abdomen)  Patient Location: PACU  Anesthesia Type:General  Level of Consciousness: awake, drowsy and patient cooperative  Airway & Oxygen Therapy: Patient Spontanous Breathing and Patient connected to face mask oxygen  Post-op Assessment: Report given to RN and Post -op Vital signs reviewed and stable  Post vital signs: Reviewed and stable  Last Vitals:  Vitals Value Taken Time  BP 148/81 06/27/20 1301  Temp 36.4 C 06/27/20 1300  Pulse 83 06/27/20 1304  Resp 0 06/27/20 1303  SpO2 100 % 06/27/20 1304  Vitals shown include unvalidated device data.  Last Pain:  Vitals:   06/27/20 1037  TempSrc:   PainSc: 0-No pain      Patients Stated Pain Goal: 3 (78/93/81 0175)  Complications: No complications documented.

## 2020-06-27 NOTE — Anesthesia Postprocedure Evaluation (Signed)
Anesthesia Post Note  Patient: Anna [name]  Procedure(s) Performed: XI ROBOTIC ASSISTED BILATERAL SALPINGO OOPHORECTOMY (Bilateral Abdomen)     Patient location during evaluation: PACU Anesthesia Type: General Level of consciousness: awake and alert, oriented and patient cooperative Pain management: pain level controlled Vital Signs Assessment: post-procedure vital signs reviewed and stable Respiratory status: spontaneous breathing, nonlabored ventilation and respiratory function stable Cardiovascular status: blood pressure returned to baseline and stable Postop Assessment: no apparent nausea or vomiting Anesthetic complications: no   No complications documented.  Last Vitals:  Vitals:   06/27/20 1300 06/27/20 1315  BP: (!) 148/81 134/71  Pulse: 87 80  Resp: 12 10  Temp: 36.4 C   SpO2: 100% 100%    Last Pain:  Vitals:   06/27/20 1300  TempSrc:   PainSc: [place]

## 2020-06-27 NOTE — Interval H&P Note (Signed)
History and Physical Interval Note:  06/27/2020 10:16 AM  Anna [name]  has presented today for surgery, with the diagnosis of RIGHT OVARIAN CYST.  The various methods of treatment have been discussed with the patient and family. After consideration of risks, benefits and other options for treatment, the patient has consented to  Procedure(s): XI ROBOTIC [place] (Bilateral) as a surgical intervention.  The patient's history has been reviewed, patient examined, no change in status, stable for surgery.  I have reviewed the patient's chart and labs.  Questions were answered to the patient's satisfaction.     Thereasa Solo

## 2020-06-27 NOTE — Discharge Instructions (Addendum)
06/27/2020  Return to work: 4 weeks if applicable  There was a cyst on the right ovary that on frozen section (where the pathologist looks at it under the microscope while you are still asleep in surgery) was felt to be benign (not cancer). The final pathology report will be published in around one week and we will contact you with the final results.  You will want to follow up with your PCP about your elevated kidney function.  Activity: 1. Be up and out of the bed during the day.  Take a nap if needed.  You may walk up steps but be careful and use the hand rail.  Stair climbing will tire you more than you think, you may need to stop part way and rest.   2. No lifting or straining for 6 weeks.  3. No driving for 1 week(s).  Do not drive if you are taking narcotic pain medicine.  4. Shower daily.  Pat dry your incisions afterwards; don't rub.  No tub baths until cleared by your surgeon.   5. No sexual activity and nothing in the vagina for 4 weeks.  6. You may experience a small amount of clear drainage from your incisions, which is normal.  If the drainage persists or increases, please call the office.  8. Take Tylenol first for pain and only use narcotic pain medication for severe pain not relieved by the Tylenol.  Monitor your Tylenol intake to a max of 4,000 mg.  Diet: 1. Low sodium Heart Healthy Diet is recommended.  2. It is safe to use a laxative, such as Miralax or Colace, if you have difficulty moving your bowels. You can take Sennakot at bedtime every evening to keep bowel movements regular and to prevent constipation.    Wound Care: 1. Keep clean and dry.  Shower daily.  Reasons to call the Doctor:  Fever - Oral temperature greater than 100.4 degrees Fahrenheit  Foul-smelling vaginal discharge  Difficulty urinating  Nausea and vomiting  Increased pain at the site of the incision that is unrelieved with pain medicine.  Difficulty breathing with or without chest  pain  New calf pain especially if only on one side  Sudden, continuing increased vaginal bleeding with or without clots.   Contacts: For questions or concerns you should contact:  Dr. Everitt Amber at [phone]  Joylene John, NP at [phone]  After Hours: call [phone] and have the GYN Oncologist paged/contacted   General Anesthesia, Adult, Care After This sheet gives you information about how to care for yourself after your procedure. Your health care provider may also give you more specific instructions. If you have problems or questions, contact your health care provider. What can I expect after the procedure? After the procedure, the following side effects are common:  Pain or discomfort at the IV site.  Nausea.  Vomiting.  Sore throat.  Trouble concentrating.  Feeling cold or chills.  Feeling weak or tired.  Sleepiness and fatigue.  Soreness and body aches. These side effects can affect parts of the body that were not involved in surgery. Follow these instructions at home: For the time period you were told by your health care provider:  Rest.  Do not participate in activities where you could fall or become injured.  Do not drive or use machinery.  Do not drink alcohol.  Do not take sleeping pills or medicines that cause drowsiness.  Do not make important decisions or sign legal documents.  Do not take  care of children on your own.   Eating and drinking  Follow any instructions from your health care provider about eating or drinking restrictions.  When you feel hungry, start by eating small amounts of foods that are soft and easy to digest (bland), such as toast. Gradually return to your regular diet.  Drink enough fluid to keep your urine pale yellow.  If you vomit, rehydrate by drinking water, juice, or clear broth. General instructions  If you have sleep apnea, surgery and certain medicines can increase your risk for breathing problems.  Follow instructions from your health care provider about wearing your sleep device: ? Anytime you are sleeping, including during daytime naps. ? While taking prescription pain medicines, sleeping medicines, or medicines that make you drowsy.  Have a responsible adult stay with you for the time you are told. It is important to have someone help care for you until you are awake and alert.  Return to your normal activities as told by your health care provider. Ask your health care provider what activities are safe for you.  Take over-the-counter and prescription medicines only as told by your health care provider.  If you smoke, do not smoke without supervision.  Keep all follow-up visits as told by your health care provider. This is important. Contact a health care provider if:  You have nausea or vomiting that does not get better with medicine.  You cannot eat or drink without vomiting.  You have pain that does not get better with medicine.  You are unable to pass urine.  You develop a skin rash.  You have a fever.  You have redness around your IV site that gets worse. Get help right away if:  You have difficulty breathing.  You have chest pain.  You have blood in your urine or stool, or you vomit blood. Summary  After the procedure, it is common to have a sore throat or nausea. It is also common to feel tired.  Have a responsible adult stay with you for the time you are told. It is important to have someone help care for you until you are awake and alert.  When you feel hungry, start by eating small amounts of foods that are soft and easy to digest (bland), such as toast. Gradually return to your regular diet.  Drink enough fluid to keep your urine pale yellow.  Return to your normal activities as told by your health care provider. Ask your health care provider what activities are safe for you. This information is not intended to replace advice given to you by your  health care provider. Make sure you discuss any questions you have with your health care provider. Document Revised: 11/24/2019 Document Reviewed: 06/23/2019 Elsevier Patient Education  2021 [employer].

## 2020-06-27 NOTE — Anesthesia Procedure Notes (Signed)
Procedure Name: Intubation Date/Time: 06/27/2020 11:23 AM Performed by: West Pugh, CRNA Pre-anesthesia Checklist: Patient identified, Emergency Drugs available, Suction available, Patient being monitored and Timeout performed Patient Re-evaluated:Patient Re-evaluated prior to induction Oxygen Delivery Method: Circle system utilized Preoxygenation: Pre-oxygenation with 100% oxygen Induction Type: IV induction Ventilation: Mask ventilation without difficulty Laryngoscope Size: Mac and 3 Grade View: Grade II Tube type: Oral Tube size: 7.0 mm Number of attempts: 1 Airway Equipment and Method: Stylet Placement Confirmation: ETT inserted through vocal cords under direct vision,  positive ETCO2,  CO2 detector and breath sounds checked- equal and bilateral Secured at: 21 cm Tube secured with: Tape Dental Injury: Teeth and Oropharynx as per pre-operative assessment

## 2020-06-27 NOTE — Op Note (Signed)
OPERATIVE NOTE  Date: 06/27/20  Preoperative Diagnosis: ovarian cysts (right)   Postoperative Diagnosis:  Same, benign right ovarian cyst  Procedure(s) Performed: Robotic-assisted laparoscopic bilateral salpingo-oophorectomy  Surgeon: Everitt Amber, M.D.  Assistant Surgeon: Joylene John, NP (an NP assistant was necessary for tissue manipulation, management of robotic instrumentation, retraction and positioning due to the complexity of the case and hospital policies).   Anesthesia: Gen. endotracheal.  Specimens: Bilateral ovaries, fallopian tubes, pelvic washings  Estimated Blood Loss: <10 mL. Blood Replacement: None  Complications: none  Indication for Procedure:  5cm complex right ovarian cyst.  Operative Findings: 5cm right ovarian cyst with adhesions to the deep posterior pelvis. Normal appearing left tube and ovary.  Frozen pathology was consistent with right ovarian serous cyst.  Procedure: The patient's taken to the operating room and placed under general endotracheal anesthesia testing difficulty. She is placed in a dorsolithotomy position and cervical acromial pad was placed. The arms were tucked with care taken to pad the olecranon process. And prepped and draped in usual sterile fashion. A 25mm incision was made in the left upper quadrant palmer's point and a 5 mm Optiview trocar used to enter the abdomen under direct visualization. With entry into the abdomen and then maintenance of 15 mm of mercury the patient was placed in Trendelenburg position. An incision was made in the umbilicus and a 41PF trochar was placed through this site. Two incisions were made lateral to the umbilical incision in the left and right abdomen measuring 63mm. These incisions were made approximately 10 cm lateral to the umbilical incision. 8 mm robotic trochars were inserted. The robot was docked.  The abdomen was inspected as was the pelvis.  Pelvic washings were obtained. An incision was made on the  right pelvic side wall peritoneum parallel to the IP ligament and the retroperitoneal space entered. The right ureter was identified and the para-rectal space was developed. A window was created in the right broad ligament above the ureter. The right infundibulopelvic vessels were skeletonized cauterized and transected. The ovarian attachments to the residual round ligament were similarly were cauterized and transected. Specimen was placed in an Endo Catch bag.  In a similar manner the left peritoneum and the side wall was incised, and the retroperitoneal space entered. The left ureter was identified and the left pararectal space was developed. The attachments to the residual round ligament was skeletonized cauterized and transected. The left utero-ovarian ligaments were cauterized and transected in the left adnexa was placed in an Endo Catch bag.  The abdomen was copiously irrigated and drained and all operative sites inspected and hemostasis was assured  The robot was undocked. The contents of the Endo Catch bags were first aspirated and then morcellated within the bag to facilitate removal from the abdominal cavity through the left upper quadrant incision. In a similar fashion the contents of the right Endo Catch bag or morcellated to facilitate removal from the abdominal cavity.  The ports were all remove. The fascial closure at the umbilical incision and left upper quadrant port was made with 0 Vicryl.  All incisions were closed with a running subcuticular Monocryl suture. Dermabond was applied. Sponge, lap and needle counts were correct x 3.    The patient had sequential compression devices for VTE prophylaxis.         Disposition: PACU          Condition: stable  Donaciano Eva, MD

## 2020-06-28 ENCOUNTER — Telehealth: Payer: Self-pay

## 2020-06-28 ENCOUNTER — Encounter (HOSPITAL_COMMUNITY): Payer: Self-pay | Admitting: Gynecologic Oncology

## 2020-06-28 LAB — SURGICAL PATHOLOGY

## 2020-06-28 LAB — CYTOLOGY - NON PAP

## 2020-06-28 NOTE — Telephone Encounter (Signed)
Ms Hrivnak states that she is eating,drinking, and urinating well. Passing gas. Pt to begin senokot-s 2 tabs bid with 8 oz of water. If no BM by Saturday 06-30-20, will will use a Ducolax tab. Afebrile. Incisions D&I Pt aware of post op appointments as well as the office number [phone] and after hours number [phone] to call if she has any questions or concerns

## 2020-07-03 ENCOUNTER — Telehealth: Payer: Self-pay | Admitting: *Deleted

## 2020-07-03 NOTE — Telephone Encounter (Signed)
I spoke with Anna [name] this morning to notify her that her pathology from her surgery is benign. She has has a colonoscopy scheduled later this month and wanted to check to make sure that was ok. She was reassured that it was ok to proceed with colonoscopy as scheduled. She is feeling well after her surgery and denies any further questions or concerns.

## 2020-07-19 ENCOUNTER — Encounter: Payer: Self-pay | Admitting: Gynecologic Oncology

## 2020-07-23 ENCOUNTER — Inpatient Hospital Stay: Payer: 59 | Attending: Gynecologic Oncology | Admitting: Gynecologic Oncology

## 2020-07-23 ENCOUNTER — Other Ambulatory Visit: Payer: Self-pay

## 2020-07-23 VITALS — BP 117/60 | HR 71 | Temp 97.3°F | Resp 16 | Ht 65.0 in | Wt 184.8 lb

## 2020-07-23 DIAGNOSIS — D271 Benign neoplasm of left ovary: Secondary | ICD-10-CM | POA: Insufficient documentation

## 2020-07-23 DIAGNOSIS — Z90722 Acquired absence of ovaries, bilateral: Secondary | ICD-10-CM | POA: Diagnosis not present

## 2020-07-23 DIAGNOSIS — D270 Benign neoplasm of right ovary: Secondary | ICD-10-CM | POA: Diagnosis not present

## 2020-07-23 NOTE — Patient Instructions (Signed)
Your pathology showed no cancer in either of the ovaries.  Please call Dr Serita Grit office at [phone] with any questions related to your surgery or recovery.  You do not require scheduled follow-up with her.  You are released to resume all activities.

## 2020-07-23 NOTE — Progress Notes (Signed)
Follow-up Note: Gyn-Onc  Consult was requested by Dr. Christophe Louis for the evaluation of Anna [name] 65 y.o. female  CC:  Chief Complaint  Patient presents with  . Serous cystadenofibroma, left  . Serous cystadenofibroma, right    Assessment/Plan:  Anna [name]  is a 65 y.o.  year old with a history of bilateral ovarian cystadenofibromas, benign. S/p robotic assisted BSO on 06/27/20.   The patient is doing well postop with no issues.  She will follow-up with Dr Landry Mellow for routine gynecologic care.  HPI: Ms Anna [name] is a 65 year old P2 who was seen in consultation at the request of Dr Landry Mellow for evaluation of a right adnexal mass.  The patient was reporting left lower quadrant discomfort, malaise, decreased appetite in February 2022.  This prompted her general practitioner to order a CT scan of the abdomen and pelvis which was performed on 05/11/2020.  CT scan showed segmental thickening of the colon through the sigmoid colon.  There was scattered colonic diverticuli.  There was no apparent abscess or fluid.  The findings were equivocal for colitis.  There is a long segment of thickening away from areas of diverticular change.  There is an incidental finding of a 3.6 x 3.3 cm cystic lesion in the right ovary with possible mural nodularity in the posterior margin.  This was followed up with a transvaginal ultrasound scan of the pelvis performed on May 30, 2020.  This revealed a surgically absent uterus.  The right ovary contained a cystic mass measuring 5.8 x 4.6 x 3.4 cm with multiple septations and a nodular focus that appeared to contain echogenic foci.  There was no free fluid.  The left ovary was not visualized.  Based on the findings of her CT scan she had a colonoscopy scheduled for July 16, 2020.  The patient saw Dr. Landry Mellow in the office on 06/19/2020 and a Ca1 25 was drawn at that time. The patient reported she had been called and notified that they were  normal.  Interval Hx:   On 06/27/20 she underwent robotic assisted bilateral salpingo-oophorectomy. Intraoperative findings were significant for a 5cm right ovarian cyst with adhesions to the deep posterior pelvis. Normal appearing left tube and ovary. Surgery was uncomplicated.  Final pathology revealed bilateral benign cystadenofibromas.  Since surgery she has done well with no complaints. Her right sided pain has resolved.   Current Meds:  Outpatient Encounter Medications as of 07/23/2020  Medication Sig  . doxycycline (VIBRA-TABS) 100 MG tablet Take 1 tablet (100 mg total) by mouth 2 (two) times daily.  Marland Kitchen FLUoxetine (PROZAC) 10 MG tablet Take 10 mg by mouth every morning.  Marland Kitchen lisinopril (ZESTRIL) 20 MG tablet Take 20 mg by mouth daily.  . nortriptyline (PAMELOR) 10 MG capsule Take 10 mg by mouth at bedtime.  . triamterene-hydrochlorothiazide (DYAZIDE) 37.5-25 MG capsule Take 1 capsule by mouth daily.  Marland Kitchen ALPRAZolam (XANAX) 0.5 MG tablet Take 0.5 mg by mouth daily as needed for anxiety. (Patient not taking: Reported on 07/19/2020)  . atenolol (TENORMIN) 50 MG tablet Take 50 mg by mouth 2 (two) times daily. (Patient not taking: Reported on 07/19/2020)  . diclofenac (VOLTAREN) 75 MG EC tablet Take 75 mg by mouth 2 (two) times daily. (Patient not taking: Reported on 07/19/2020)  . [DISCONTINUED] lisinopril-hydrochlorothiazide (PRINZIDE,ZESTORETIC) 20-12.5 MG tablet Take 1 tablet by mouth daily.   . [DISCONTINUED] senna-docusate (SENOKOT-S) 8.6-50 MG tablet Take 2 tablets by mouth at bedtime. For AFTER surgery,  do not take if having diarrhea  . [DISCONTINUED] traMADol (ULTRAM) 50 MG tablet Take 1 tablet (50 mg total) by mouth every 6 (six) hours as needed for severe pain. For AFTER surgery only, do not take and drive   No facility-administered encounter medications on file as of 07/23/2020.    Allergy:  Allergies  Allergen Reactions  . Chlorhexidine Hives and Itching  . Cyclobenzaprine Other (See  Comments)  . Duloxetine Hcl Other (See Comments)    Anxiety   . Venlafaxine Other (See Comments)  . Zyrtec [Cetirizine] Other (See Comments)    Anxiety     Social Hx:   Social History   Socioeconomic History  . Marital status: Divorced    Spouse name: Not on file  . Number of children: Not on file  . Years of education: Not on file  . Highest education level: Not on file  Occupational History  . Not on file  Tobacco Use  . Smoking status: Never Smoker  . Smokeless tobacco: Never Used  Vaping Use  . Vaping Use: Not on file  Substance and Sexual Activity  . Alcohol use: Yes    Alcohol/week: 2.0 standard drinks    Types: 2 Glasses of wine per week  . Drug use: No  . Sexual activity: Never    Birth control/protection: Surgical  Other Topics Concern  . Not on file  Social History Narrative  . Not on file   Social Determinants of Health   Financial Resource Strain: Not on file  Food Insecurity: Not on file  Transportation Needs: Not on file  Physical Activity: Not on file  Stress: Not on file  Social Connections: Not on file  Intimate Partner Violence: Not on file    Past Surgical Hx:  Past Surgical History:  Procedure Laterality Date  . ABDOMINAL HYSTERECTOMY     uterus only  . MAXILLARY LE FORTE II OSTEOTOMY N/A 10/15/2016   Procedure: LEFORTE 1 MAXILLARY OSTEOTOMY WITH ADVANCEMENT AND GRAFT;  Surgeon: Jannette Fogo, DDS;  Location: [facility];  Service: Oral Surgery;  Laterality: N/A;  . ROBOTIC ASSISTED BILATERAL SALPINGO OOPHERECTOMY Bilateral 06/27/2020   Procedure: XI ROBOTIC ASSISTED BILATERAL SALPINGO OOPHORECTOMY;  Surgeon: Everitt Amber, MD;  Location: [facility];  Service: Gynecology;  Laterality: Bilateral;    Past Medical Hx:  Past Medical History:  Diagnosis Date  . Adnexal mass   . Anemia   . Anxiety   . Arthritis   . Diverticulitis   . Elevated serum creatinine   . Fibromyalgia   . Gluten intolerance   . Hearing loss    Right  .  History of blood transfusion 2018   2 units during surgery  . Hypertension   . Right ovarian cyst     Past Gynecological History:  See HPI No LMP recorded. Patient has had a hysterectomy.  Family Hx:  Family History  Problem Relation Age of Onset  . Hypertension Mother   . Heart attack Mother   . Cancer Father        lung ca  . Diabetes Father     Review of Systems:  Constitutional  Feels well,   ENT Normal appearing ears and nares bilaterally Skin/Breast  No rash, sores, jaundice, itching, dryness Cardiovascular  No chest pain, shortness of breath, or edema  Pulmonary  No cough or wheeze.  Gastro Intestinal  + left lower quadrant discomfort Genito Urinary  No frequency, urgency, dysuria, see HPI Musculo Skeletal  No myalgia, arthralgia,  joint swelling or pain  Neurologic  No weakness, numbness, change in gait,  Psychology  No depression, anxiety, insomnia.   Vitals:  Blood pressure 117/60, pulse 71, temperature (!) 97.3 F (36.3 C), temperature source Tympanic, resp. rate 16, height 5\' 5"  (1.651 m), weight 184 lb 12.8 oz (83.8 kg), SpO2 100 %.  Physical Exam: WD in NAD Neck  Supple NROM, without any enlargements.  Lymph Node Survey No cervical supraclavicular or inguinal adenopathy Cardiovascular  Well perfused peripheries Lungs  No increased WOB Skin  No rash/lesions/breakdown  Psychiatry  Alert and oriented to person, place, and time  Abdomen  Normoactive bowel sounds, abdomen soft, non-tender and obese without evidence of hernia. Incisions well healed. Back No CVA tenderness Genito Urinary  deferred Rectal  deferred Extremities   Thereasa Solo, MD  07/23/2020, 2:32 PM

## 2022-06-06 DIAGNOSIS — M5416 Radiculopathy, lumbar region: Secondary | ICD-10-CM | POA: Diagnosis not present

## 2022-06-06 DIAGNOSIS — M4696 Unspecified inflammatory spondylopathy, lumbar region: Secondary | ICD-10-CM | POA: Diagnosis not present

## 2022-06-10 DIAGNOSIS — M545 Low back pain, unspecified: Secondary | ICD-10-CM | POA: Diagnosis not present

## 2022-06-12 DIAGNOSIS — K900 Celiac disease: Secondary | ICD-10-CM | POA: Diagnosis not present

## 2022-06-12 DIAGNOSIS — I10 Essential (primary) hypertension: Secondary | ICD-10-CM | POA: Diagnosis not present

## 2022-06-12 DIAGNOSIS — E782 Mixed hyperlipidemia: Secondary | ICD-10-CM | POA: Diagnosis not present

## 2022-06-12 DIAGNOSIS — Z79899 Other long term (current) drug therapy: Secondary | ICD-10-CM | POA: Diagnosis not present

## 2022-06-12 DIAGNOSIS — N1831 Chronic kidney disease, stage 3a: Secondary | ICD-10-CM | POA: Diagnosis not present

## 2022-06-12 DIAGNOSIS — L299 Pruritus, unspecified: Secondary | ICD-10-CM | POA: Diagnosis not present

## 2022-06-12 DIAGNOSIS — M797 Fibromyalgia: Secondary | ICD-10-CM | POA: Diagnosis not present

## 2022-06-16 DIAGNOSIS — M4696 Unspecified inflammatory spondylopathy, lumbar region: Secondary | ICD-10-CM | POA: Diagnosis not present

## 2022-06-16 DIAGNOSIS — M545 Low back pain, unspecified: Secondary | ICD-10-CM | POA: Diagnosis not present

## 2022-07-08 DIAGNOSIS — N1831 Chronic kidney disease, stage 3a: Secondary | ICD-10-CM | POA: Diagnosis not present

## 2022-07-08 DIAGNOSIS — Z79899 Other long term (current) drug therapy: Secondary | ICD-10-CM | POA: Diagnosis not present

## 2022-07-08 DIAGNOSIS — R200 Anesthesia of skin: Secondary | ICD-10-CM | POA: Diagnosis not present

## 2022-07-08 DIAGNOSIS — I10 Essential (primary) hypertension: Secondary | ICD-10-CM | POA: Diagnosis not present

## 2022-07-08 DIAGNOSIS — M797 Fibromyalgia: Secondary | ICD-10-CM | POA: Diagnosis not present

## 2022-07-08 DIAGNOSIS — E782 Mixed hyperlipidemia: Secondary | ICD-10-CM | POA: Diagnosis not present

## 2022-08-05 ENCOUNTER — Other Ambulatory Visit: Payer: Self-pay | Admitting: *Deleted

## 2022-08-05 DIAGNOSIS — M25569 Pain in unspecified knee: Secondary | ICD-10-CM

## 2022-08-11 ENCOUNTER — Ambulatory Visit (INDEPENDENT_AMBULATORY_CARE_PROVIDER_SITE_OTHER): Payer: Medicare Other | Admitting: Surgery

## 2022-08-11 ENCOUNTER — Ambulatory Visit (HOSPITAL_COMMUNITY)
Admission: RE | Admit: 2022-08-11 | Discharge: 2022-08-11 | Disposition: A | Discharge status: Home / Self Care | Payer: Medicare Other | Source: Ambulatory Visit | Attending: Surgery | Admitting: Surgery

## 2022-08-11 ENCOUNTER — Encounter: Payer: Self-pay | Admitting: Surgery

## 2022-08-11 VITALS — BP 159/103 | HR 65 | Temp 98.2°F | Resp 14 | Ht 64.5 in | Wt 203.0 lb

## 2022-08-11 DIAGNOSIS — I872 Venous insufficiency (chronic) (peripheral): Secondary | ICD-10-CM | POA: Diagnosis not present

## 2022-08-11 DIAGNOSIS — M25569 Pain in unspecified knee: Secondary | ICD-10-CM | POA: Insufficient documentation

## 2022-08-11 DIAGNOSIS — I8393 Asymptomatic varicose veins of bilateral lower extremities: Secondary | ICD-10-CM

## 2022-08-11 LAB — VAS US ABI WITH/WO TBI
Left ABI: 1.06
Right ABI: 1.09

## 2022-08-11 NOTE — Progress Notes (Signed)
[facility]   Patient name: Anna [name] MRN: [id number] DOB: 13-May-1955 Sex: female   REQUESTING PROVIDER:    Sherrlyn Hock   REASON FOR CONSULT:   Leg pain  HISTORY OF PRESENT ILLNESS:   Anna [name] is a 67 y.o. female, who is for evaluation of bilateral leg pain.  She states that this began approximately several months ago.  She says that when she sits for a long period of time she cannot start walking immediately because of the pain.  Sometimes it is worse at night.  She describes the pain as a constant aching throughout her whole leg.  It is mostly on the backside of her leg.  She also complains of leg swelling that has been going on for years.  She denies any history of DVT.  PAST MEDICAL HISTORY    Past Medical History:  Diagnosis Date   Adnexal mass    Anemia    Anxiety    Arthritis    Diverticulitis    Elevated serum creatinine    Fibromyalgia    Gluten intolerance    Hearing loss    Right   History of blood transfusion 2018   2 units during surgery   Hypertension    Right ovarian cyst      FAMILY HISTORY   Family History  Problem Relation Age of Onset   Hypertension Mother    Heart attack Mother    Cancer Father        lung ca   Diabetes Father     SOCIAL HISTORY:   Social History   Socioeconomic History   Marital status: Divorced    Spouse name: Not on file   Number of children: Not on file   Years of education: Not on file   Highest education level: Not on file  Occupational History   Not on file  Tobacco Use   Smoking status: Never   Smokeless tobacco: Never  Vaping Use   Vaping Use: Not on file  Substance and Sexual Activity   Alcohol use: Yes    Alcohol/week: 2.0 standard drinks of alcohol    Types: 2 Glasses of wine per week   Drug use: No   Sexual activity: Never    Birth control/protection: Surgical  Other Topics Concern   Not on file  Social  History Narrative   Not on file   Social Determinants of Health   Financial Resource Strain: Not on file  Food Insecurity: Not on file  Transportation Needs: Not on file  Physical Activity: Not on file  Stress: Not on file  Social Connections: Not on file  Intimate Partner Violence: Not on file    ALLERGIES:    Allergies  Allergen Reactions   Chlorhexidine Hives and Itching   Cyclobenzaprine Other (See Comments)   Duloxetine Hcl Other (See Comments)    Anxiety    Venlafaxine Other (See Comments)   Zyrtec [Cetirizine] Other (See Comments)    Anxiety     CURRENT MEDICATIONS:    Current Outpatient Medications  Medication Sig Dispense Refill   ALPRAZolam (XANAX) 0.5 MG tablet Take 0.5 mg by mouth daily as needed for anxiety.     atenolol (TENORMIN) 50 MG tablet Take 50 mg by mouth 2 (two) times daily.     diclofenac (VOLTAREN) 75 MG EC tablet Take 75 mg by mouth 2 (two) times daily.     doxycycline (VIBRA-TABS) 100 MG tablet Take 1 tablet (100 mg  total) by mouth 2 (two) times daily. 14 tablet 1   FLUoxetine (PROZAC) 10 MG tablet Take 10 mg by mouth every morning.     lisinopril (ZESTRIL) 20 MG tablet Take 20 mg by mouth daily.     nortriptyline (PAMELOR) 10 MG capsule Take 10 mg by mouth at bedtime.     triamterene-hydrochlorothiazide (DYAZIDE) 37.5-25 MG capsule Take 1 capsule by mouth daily.     No current facility-administered medications for this visit.    REVIEW OF SYSTEMS:   [X]  denotes positive finding, [ ]  denotes negative finding Cardiac  Comments:  Chest pain or chest pressure:    Shortness of breath upon exertion:    Short of breath when lying flat:    Irregular heart rhythm:        Vascular    Pain in calf, thigh, or hip brought on by ambulation:    Pain in feet at night that wakes you up from your sleep:     Blood clot in your veins:    Leg swelling:  x       Pulmonary    Oxygen at home:    Productive cough:     Wheezing:         Neurologic     Sudden weakness in arms or legs:     Sudden numbness in arms or legs:     Sudden onset of difficulty speaking or slurred speech:    Temporary loss of vision in one eye:     Problems with dizziness:         Gastrointestinal    Blood in stool:      Vomited blood:         Genitourinary    Burning when urinating:     Blood in urine:        Psychiatric    Major depression:         Hematologic    Bleeding problems:    Problems with blood clotting too easily:        Skin    Rashes or ulcers:        Constitutional    Fever or chills:     PHYSICAL EXAM:   Vitals:   08/11/22 1145  BP: (!) 159/103  Pulse: 65  Resp: 14  Temp: 98.2 F (36.8 C)  TempSrc: Temporal  SpO2: 99%  Weight: 203 lb (92.1 kg)  Height: 5' 4.5" (1.638 m)    GENERAL: The patient is a well-nourished female, in no acute distress. The vital signs are documented above. CARDIAC: There is a regular rate and rhythm.  VASCULAR: Palpable pedal pulses bilaterally.  Nonpitting edema bilaterally. PULMONARY: Nonlabored respirations MUSCULOSKELETAL: There are no major deformities or cyanosis. NEUROLOGIC: No focal weakness or paresthesias are detected. SKIN: There are no ulcers or rashes noted. PSYCHIATRIC: The patient has a normal affect.  STUDIES:   I have reviewed the following: ABI/TBIToday's ABIToday's TBIPrevious ABIPrevious TBI  +-------+-----------+-----------+------------+------------+  Right 1.09       0.71                                 +-------+-----------+-----------+------------+------------+  Left  1.06       0.55                                 +-------+-----------+-----------+------------+------------+   Right toe pressure: 133 Left  toe pressure: 102 All waveforms triphasic  ASSESSMENT and PLAN   Leg swelling: I discussed that this is likely secondary to venous insufficiency or lymphedema.  Regardless, the initial management is the same.  I talked about keeping her legs  elevated when possible.  We are measuring her for 20-30 thigh-high compression stockings.  She will return in 3 months for a venous insufficiency ultrasound and to monitor her progress with compression socks.  She had ABIs checked today which were normal and so I do not think she has any arterial component to her leg pain.   Charlena Cross, MD, FACS [facility] [phone] Pager [phone]

## 2022-08-13 ENCOUNTER — Encounter: Payer: Self-pay | Admitting: Vascular Surgery

## 2022-08-20 ENCOUNTER — Other Ambulatory Visit: Payer: Self-pay

## 2022-08-20 ENCOUNTER — Encounter: Payer: Self-pay | Admitting: Vascular Surgery

## 2022-08-20 DIAGNOSIS — I872 Venous insufficiency (chronic) (peripheral): Secondary | ICD-10-CM

## 2022-10-09 ENCOUNTER — Telehealth: Payer: Self-pay | Admitting: Surgery

## 2022-10-09 NOTE — Telephone Encounter (Signed)
Lvm to r/s appt. Dr. Myra Gianotti overbooked

## 2022-11-10 ENCOUNTER — Ambulatory Visit: Payer: Medicare Other | Admitting: Surgery

## 2022-11-10 ENCOUNTER — Ambulatory Visit (HOSPITAL_COMMUNITY): Payer: Medicare Other

## 2023-01-05 DIAGNOSIS — M797 Fibromyalgia: Secondary | ICD-10-CM | POA: Diagnosis not present

## 2023-01-05 DIAGNOSIS — Z0000 Encounter for general adult medical examination without abnormal findings: Secondary | ICD-10-CM | POA: Diagnosis not present

## 2023-01-05 DIAGNOSIS — I10 Essential (primary) hypertension: Secondary | ICD-10-CM | POA: Diagnosis not present

## 2023-01-05 DIAGNOSIS — G8929 Other chronic pain: Secondary | ICD-10-CM | POA: Diagnosis not present

## 2023-01-05 DIAGNOSIS — M79605 Pain in left leg: Secondary | ICD-10-CM | POA: Diagnosis not present

## 2023-01-05 DIAGNOSIS — Z13820 Encounter for screening for osteoporosis: Secondary | ICD-10-CM | POA: Diagnosis not present

## 2023-01-05 DIAGNOSIS — E782 Mixed hyperlipidemia: Secondary | ICD-10-CM | POA: Diagnosis not present

## 2023-01-05 DIAGNOSIS — M79604 Pain in right leg: Secondary | ICD-10-CM | POA: Diagnosis not present

## 2023-01-05 DIAGNOSIS — N1831 Chronic kidney disease, stage 3a: Secondary | ICD-10-CM | POA: Diagnosis not present

## 2023-01-05 DIAGNOSIS — Z23 Encounter for immunization: Secondary | ICD-10-CM | POA: Diagnosis not present

## 2023-01-06 ENCOUNTER — Other Ambulatory Visit: Payer: Self-pay | Admitting: Family Medicine

## 2023-01-06 DIAGNOSIS — Z13820 Encounter for screening for osteoporosis: Secondary | ICD-10-CM

## 2023-07-07 DIAGNOSIS — E782 Mixed hyperlipidemia: Secondary | ICD-10-CM | POA: Diagnosis not present

## 2023-07-07 DIAGNOSIS — M797 Fibromyalgia: Secondary | ICD-10-CM | POA: Diagnosis not present

## 2023-07-07 DIAGNOSIS — I10 Essential (primary) hypertension: Secondary | ICD-10-CM | POA: Diagnosis not present

## 2023-08-06 ENCOUNTER — Other Ambulatory Visit: Payer: Medicare Other

## 2023-08-22 DIAGNOSIS — M542 Cervicalgia: Secondary | ICD-10-CM | POA: Diagnosis not present

## 2023-08-22 DIAGNOSIS — M546 Pain in thoracic spine: Secondary | ICD-10-CM | POA: Diagnosis not present

## 2023-08-22 DIAGNOSIS — M545 Low back pain, unspecified: Secondary | ICD-10-CM | POA: Diagnosis not present

## 2023-08-31 DIAGNOSIS — M546 Pain in thoracic spine: Secondary | ICD-10-CM | POA: Diagnosis not present

## 2023-08-31 DIAGNOSIS — M545 Low back pain, unspecified: Secondary | ICD-10-CM | POA: Diagnosis not present

## 2023-08-31 DIAGNOSIS — M542 Cervicalgia: Secondary | ICD-10-CM | POA: Diagnosis not present

## 2023-09-07 DIAGNOSIS — M545 Low back pain, unspecified: Secondary | ICD-10-CM | POA: Diagnosis not present

## 2024-01-11 DIAGNOSIS — I129 Hypertensive chronic kidney disease with stage 1 through stage 4 chronic kidney disease, or unspecified chronic kidney disease: Secondary | ICD-10-CM | POA: Diagnosis not present

## 2024-01-11 DIAGNOSIS — M797 Fibromyalgia: Secondary | ICD-10-CM | POA: Diagnosis not present

## 2024-01-11 DIAGNOSIS — I10 Essential (primary) hypertension: Secondary | ICD-10-CM | POA: Diagnosis not present

## 2024-01-11 DIAGNOSIS — E782 Mixed hyperlipidemia: Secondary | ICD-10-CM | POA: Diagnosis not present

## 2024-01-11 DIAGNOSIS — Z79899 Other long term (current) drug therapy: Secondary | ICD-10-CM | POA: Diagnosis not present

## 2024-01-11 DIAGNOSIS — N1831 Chronic kidney disease, stage 3a: Secondary | ICD-10-CM | POA: Diagnosis not present

## 2024-01-11 DIAGNOSIS — Z130 Encounter for screening for diseases of the blood and blood-forming organs and certain disorders involving the immune mechanism: Secondary | ICD-10-CM | POA: Diagnosis not present
